# Patient Record
Sex: Female | Born: 1970 | Hispanic: No | Marital: Married | Smoking: Former smoker
Health system: Southern US, Community
[De-identification: ages and names within clinical notes are randomized; demographics above are authoritative.]

## PROBLEM LIST (undated history)

## (undated) DIAGNOSIS — E785 Hyperlipidemia, unspecified: Secondary | ICD-10-CM

## (undated) DIAGNOSIS — J302 Other seasonal allergic rhinitis: Secondary | ICD-10-CM

## (undated) DIAGNOSIS — E7800 Pure hypercholesterolemia, unspecified: Secondary | ICD-10-CM

## (undated) HISTORY — PX: DILATION AND CURETTAGE OF UTERUS: SHX78

## (undated) HISTORY — DX: Other seasonal allergic rhinitis: J30.2

## (undated) HISTORY — DX: Pure hypercholesterolemia, unspecified: E78.00

## (undated) HISTORY — DX: Hyperlipidemia, unspecified: E78.5

---

## 2002-06-20 ENCOUNTER — Other Ambulatory Visit: Admission: RE | Admit: 2002-06-20 | Discharge: 2002-06-20 | Payer: Self-pay | Admitting: Gynecology

## 2003-02-01 ENCOUNTER — Encounter: Admission: RE | Admit: 2003-02-01 | Discharge: 2003-02-01 | Payer: Self-pay | Admitting: Family Medicine

## 2003-02-01 ENCOUNTER — Encounter: Payer: Self-pay | Admitting: Family Medicine

## 2003-02-10 ENCOUNTER — Encounter: Admission: RE | Admit: 2003-02-10 | Discharge: 2003-04-01 | Payer: Self-pay | Admitting: Family Medicine

## 2003-06-25 ENCOUNTER — Other Ambulatory Visit: Admission: RE | Admit: 2003-06-25 | Discharge: 2003-06-25 | Payer: Self-pay | Admitting: Gynecology

## 2004-07-26 ENCOUNTER — Other Ambulatory Visit: Admission: RE | Admit: 2004-07-26 | Discharge: 2004-07-26 | Payer: Self-pay | Admitting: Gynecology

## 2005-08-05 ENCOUNTER — Other Ambulatory Visit: Admission: RE | Admit: 2005-08-05 | Discharge: 2005-08-05 | Payer: Self-pay | Admitting: Gynecology

## 2006-08-08 ENCOUNTER — Other Ambulatory Visit: Admission: RE | Admit: 2006-08-08 | Discharge: 2006-08-08 | Payer: Self-pay | Admitting: Gynecology

## 2007-05-31 HISTORY — PX: TUBAL LIGATION: SHX77

## 2007-08-10 ENCOUNTER — Other Ambulatory Visit: Admission: RE | Admit: 2007-08-10 | Discharge: 2007-08-10 | Payer: Self-pay | Admitting: Gynecology

## 2007-10-05 ENCOUNTER — Ambulatory Visit (HOSPITAL_BASED_OUTPATIENT_CLINIC_OR_DEPARTMENT_OTHER): Admission: RE | Admit: 2007-10-05 | Discharge: 2007-10-05 | Payer: Self-pay | Admitting: Gynecology

## 2008-11-13 ENCOUNTER — Encounter: Payer: Self-pay | Admitting: Gynecology

## 2008-11-13 ENCOUNTER — Other Ambulatory Visit: Admission: RE | Admit: 2008-11-13 | Discharge: 2008-11-13 | Payer: Self-pay | Admitting: Gynecology

## 2008-11-13 ENCOUNTER — Ambulatory Visit: Payer: Self-pay | Admitting: Gynecology

## 2009-11-16 ENCOUNTER — Other Ambulatory Visit: Admission: RE | Admit: 2009-11-16 | Discharge: 2009-11-16 | Payer: Self-pay | Admitting: Gynecology

## 2009-11-16 ENCOUNTER — Ambulatory Visit: Payer: Self-pay | Admitting: Gynecology

## 2009-12-04 ENCOUNTER — Encounter: Admission: RE | Admit: 2009-12-04 | Discharge: 2009-12-04 | Payer: Self-pay | Admitting: Gynecology

## 2010-10-12 NOTE — Op Note (Signed)
[name], Gina Pratt             ACCOUNT NO.:  [id number]   MEDICAL RECORD NO.:  [id number]          PATIENT TYPE:  AMB   LOCATION:  [facility]                         FACILITY:  [facility]   PHYSICIAN:  Timothy P. Fontaine, M.D.DATE OF BIRTH:  16-Aug-1970   DATE OF PROCEDURE:  10/05/2007  DATE OF DISCHARGE:                               OPERATIVE REPORT   PREOPERATIVE DIAGNOSIS:  Desires permanent sterilization.   POSTOPERATIVE DIAGNOSIS:  Desires permanent sterilization.   PROCEDURE:  Laparoscopic bilateral tubal sterilization, Falope ring  technique.   SURGEON:  Timothy P. Fontaine, M.D.   ANESTHETIC:  General.   COMPLICATIONS:  None.   ESTIMATED BLOOD LOSS:  Minimal.   SPECIMEN:  None.   FINDINGS:  EUA:  External, BUS, vagina normal.  Cervix normal uterus  normal size, midline and mobile.  Adnexa without masses.  Laparoscopic:  Anterior cul-de-sac normal.  Posterior cul-de-sac normal.  Uterus normal  size, shape, and contour.  Right and left fallopian tubes normal length,  caliber, fimbriated ends.  Single Falope ring applied to both sides.  Right and left ovaries grossly normal, free and mobile.  No evidence of  pelvic endometriosis or adhesive disease.  Upper abdominal exam is  normal, noting liver smooth.  Gallbladder not visualized.  Proximal  portion of appendix grossly normal, distal portion not visualized.   PROCEDURE:  The patient was taken to the operating room and underwent  general anesthesia.  She was placed in the low dorsal lithotomy  position.  Received an abdominal, perineal, and vaginal preparation with  Betadine solution.  Bladder emptied with in-and-out Foley  catheterization.  EUA performed, and a Hulka tenaculum was placed in the  cervix.  The patient was draped in the usual fashion.  A vertical  infraumbilical incision was made.  Using the 10 mm OptiVu laparoscopic  trocar the abdomen was entered under direct visualization without  difficulty and  subsequently insufflated.  A midline suprapubic incision  was made and the Falope ring-applying trocar was placed within the  abdominal cavity without difficulty under direct visualization after  transillumination for the vessels.  Examination of the pelvic organs,  upper abdominal exam was carried out with findings noted above.  The  right fallopian tube was then identified, traced from its insertion to  its fimbriated end.  Midtubal segment was then drawn up into the Falope  ring applier, and a single Falope ring applied.  A good segment of the  fallopian tube was within the ring, and manipulation ensured secure  placement.  A.  similar procedure was carried out on the other side.  The suprapubic port was then removed.  Adequate hemostasis visualized.  Gas allowed to escape.  The infraumbilical port was then backed out  under direct visualization, showing adequate hemostasis.  No evidence of  hernia formation.  Both skin  incisions were injected using 0.25% Marcaine, and both skin incisions  closed using Dermabond skin adhesive.  The Hulka tenaculum was removed.  The patient was placed in the supine position, awakened without  difficulty, and taken to the recovery room in good condition, having  tolerated her  procedure well.      Timothy P. Fontaine, M.D.  Electronically Signed     TPF/MEDQ  D:  10/05/2007  T:  10/05/2007  Job:  [id number]

## 2010-10-12 NOTE — H&P (Signed)
Gina Pratt, Gina Pratt             ACCOUNT NO.:  [id number]   MEDICAL RECORD NO.:  [id number]          PATIENT TYPE:  AMB   LOCATION:  [facility]                         FACILITY:  [facility]   PHYSICIAN:  Timothy P. Fontaine, M.D.DATE OF BIRTH:  10/31/1970   DATE OF ADMISSION:  10/05/2007  DATE OF DISCHARGE:                              HISTORY & PHYSICAL   The patient is being admitted to [facility] for surgery  on Oct 05, 2007 at 8:45.   CHIEF COMPLAINT:  Sterilization.   HISTORY OF PRESENT ILLNESS:  A 40 year old G3, P2, AB 1 female who  presents for tubal sterilization.  The patient was counseled as to all  reversible and irreversible alternatives, and she wants to proceed with  tubal sterilization.  She had an attempt at an IUD, but discontinued  this due to side effects.   PAST MEDICAL HISTORY:  None.   PAST SURGICAL HISTORY:  None.   ALLERGIES:  No medications.   CURRENT MEDICATIONS:  None.   REVIEW OF SYSTEMS:  Noncontributory.   FAMILY HISTORY:  Noncontributory.   SOCIAL HISTORY:  Noncontributory.   ADMISSION PHYSICAL EXAMINATION:  VITAL SIGNS:  Afebrile.  Vital signs  are stable.  HEENT: Normal.  LUNGS:  Clear.  CARDIAC:  Regular rate.  No rubs, murmurs, or gallops.  ABDOMEN:  Benign.  PELVIC:  External, BUS, vagina normal.  Cervix normal.  Uterus normal  size, midline, mobile, nontender.  Adnexa without masses or tenderness.   ASSESSMENT:  A 40 year old G3, P2, AB 1 female for laparoscopic tubal  sterilization.  I reviewed all available contraceptive options with her.  Her husband refuses vasectomy.  She has trialed an IUD, but discontinued  due to side effects, and she wants to proceed with tubal sterilization.  I reviewed what is involved with the procedure.  General anesthesia,  laparoscopic surgery, trocar placement, insufflation, multiple port  sites, use of Falope rings, and the possibility of cautery were all  reviewed with her.  I reviewed  the permanency of the procedure, but also  the risk of failure, and she clearly understands these issues.  The  risks of infection requiring prolonged antibiotics, incisional  infections requiring opening and draining of incisions, closure by  secondary intention, incisional issues with cosmetics, keloid formation,  as well as hernia formation, were all discussed, understood, and  accepted.  The risks of hemorrhage necessitating transfusion and the  risks of transfusion were reviewed, as well as the risk of inadvertent  injury to internal organs, including bowel, bladder, ureters, vessels,  and nerves, necessitating major exploratory  reparative surgeries, future reparative surgeries, ostomy formation,  bowel resection, bladder repair, all discussed, understood and accepted.  The patient's questions were answered to her satisfaction.  She is ready  to proceed with the surgery.      Timothy P. Fontaine, M.D.  Electronically Signed     TPF/MEDQ  D:  10/02/2007  T:  10/02/2007  Job:  [id number]

## 2010-12-16 ENCOUNTER — Other Ambulatory Visit: Payer: Self-pay | Admitting: Family Medicine

## 2010-12-16 ENCOUNTER — Ambulatory Visit
Admission: RE | Admit: 2010-12-16 | Discharge: 2010-12-16 | Disposition: A | Discharge status: Home / Self Care | Payer: 59 | Source: Ambulatory Visit | Attending: Family Medicine | Admitting: Family Medicine

## 2010-12-16 DIAGNOSIS — R1031 Right lower quadrant pain: Secondary | ICD-10-CM

## 2010-12-16 MED ORDER — IOHEXOL 300 MG/ML  SOLN
100.0000 mL | Freq: Once | INTRAMUSCULAR | Status: AC | PRN
  Administered 2010-12-16: 100 mL via INTRAVENOUS

## 2011-01-14 ENCOUNTER — Encounter: Payer: Self-pay | Admitting: *Deleted

## 2011-01-17 ENCOUNTER — Encounter: Payer: Self-pay | Admitting: Gynecology

## 2011-01-17 ENCOUNTER — Other Ambulatory Visit (HOSPITAL_COMMUNITY)
Admission: RE | Admit: 2011-01-17 | Discharge: 2011-01-17 | Disposition: A | Discharge status: Home / Self Care | Payer: 59 | Source: Ambulatory Visit | Attending: Gynecology | Admitting: Gynecology

## 2011-01-17 ENCOUNTER — Ambulatory Visit (INDEPENDENT_AMBULATORY_CARE_PROVIDER_SITE_OTHER): Payer: 59 | Admitting: Gynecology

## 2011-01-17 VITALS — BP 120/74 | Ht 60.0 in | Wt 145.0 lb

## 2011-01-17 DIAGNOSIS — R823 Hemoglobinuria: Secondary | ICD-10-CM

## 2011-01-17 DIAGNOSIS — Z13220 Encounter for screening for lipoid disorders: Secondary | ICD-10-CM

## 2011-01-17 DIAGNOSIS — Z01419 Encounter for gynecological examination (general) (routine) without abnormal findings: Secondary | ICD-10-CM | POA: Insufficient documentation

## 2011-01-17 DIAGNOSIS — Z131 Encounter for screening for diabetes mellitus: Secondary | ICD-10-CM

## 2011-01-17 NOTE — Progress Notes (Signed)
TALLIE DODDS 06/17/1970 [id number]        40 y.o.  for annual exam.  Had episode of lower pelvic abdominal pain earlier this month saw her primary who ultimately ordered a CT scan which was negative. There is some question of a collapsed luteal cyst. Patient notes that her pain has resolved. She is status post BTL, periods are regular. She did have a mildly abnormal lipid profile last year was to follow up for fasting but never did that. She notes she is exercising now and is fasting today we'll check a lipid profile.  Past medical history,surgical history, allergies, family history and social history were all reviewed and documented in the EPIC chart. ROS:  Was performed and pertinent positives and negatives are included in the history.  Exam: chaperone present Filed Vitals:   01/17/11 0901  BP: 120/74   General appearance  Normal Skin grossly normal Head/Neck normal with no cervical or supraclavicular adenopathy thyroid normal Lungs  clear Cardiac RR, without RMG Abdominal  soft, nontender, without masses, organomegaly or hernia Breasts  examined lying and sitting without masses, retractions, discharge or axillary adenopathy. Pelvic  Ext/BUS/vagina  normal   Cervix  normal  Pap done  Uterus  anteverted, normal size, shape and contour, midline and mobile nontender   Adnexa  Without masses or tenderness    Anus and perineum  normal   Rectovaginal  normal sphincter tone without palpated masses or tenderness.    Assessment/Plan:  40 y.o. female for annual exam.   Overall doing well. Had episode of abdominal pelvic pain previously now resolved with a negative CT. Patient will monitor as long as pain free we'll follow if recurrence she is to present to me for further evaluation. Self breast exams on a monthly basis discussed and urge. She is due for her mammogram now and I reminded her to schedule this. Increased calcium and vitamin D reviewed. We'll check baseline labs of CBC, glucose,  fasting lipid profile and urinalysis.    Dara Lords MD, 9:22 AM 01/17/2011

## 2011-01-18 ENCOUNTER — Telehealth: Payer: Self-pay | Admitting: *Deleted

## 2011-01-18 DIAGNOSIS — E7800 Pure hypercholesterolemia, unspecified: Secondary | ICD-10-CM

## 2011-01-18 NOTE — Telephone Encounter (Signed)
PT INFORMED WITH THE BELOW NOTE, ORDER IN COMPUTER

## 2011-01-18 NOTE — Telephone Encounter (Signed)
Message copied by Aura Camps on Tue Jan 18, 2011  9:20 AM ------      Message from: Dara Lords      Created: Tue Jan 18, 2011  9:06 AM       tell patient of mildly elevated cholesterol and LDL recommend rechecking a fasting lipid profile

## 2011-06-14 ENCOUNTER — Other Ambulatory Visit: Payer: Self-pay | Admitting: Family Medicine

## 2011-06-14 DIAGNOSIS — Z1231 Encounter for screening mammogram for malignant neoplasm of breast: Secondary | ICD-10-CM

## 2011-06-29 ENCOUNTER — Ambulatory Visit
Admission: RE | Admit: 2011-06-29 | Discharge: 2011-06-29 | Disposition: A | Discharge status: Home / Self Care | Payer: 59 | Source: Ambulatory Visit | Attending: Family Medicine | Admitting: Family Medicine

## 2011-06-29 DIAGNOSIS — Z1231 Encounter for screening mammogram for malignant neoplasm of breast: Secondary | ICD-10-CM

## 2012-03-27 ENCOUNTER — Ambulatory Visit (INDEPENDENT_AMBULATORY_CARE_PROVIDER_SITE_OTHER): Payer: 59 | Admitting: Gynecology

## 2012-03-27 ENCOUNTER — Encounter: Payer: Self-pay | Admitting: Gynecology

## 2012-03-27 VITALS — BP 118/74 | Ht 60.0 in | Wt 150.0 lb

## 2012-03-27 DIAGNOSIS — Z13220 Encounter for screening for lipoid disorders: Secondary | ICD-10-CM

## 2012-03-27 DIAGNOSIS — Z01419 Encounter for gynecological examination (general) (routine) without abnormal findings: Secondary | ICD-10-CM

## 2012-03-27 DIAGNOSIS — Z131 Encounter for screening for diabetes mellitus: Secondary | ICD-10-CM

## 2012-03-27 DIAGNOSIS — M2550 Pain in unspecified joint: Secondary | ICD-10-CM

## 2012-03-27 LAB — CBC WITH DIFFERENTIAL/PLATELET
Basophils Absolute: 0 10*3/uL (ref 0.0–0.1)
Eosinophils Relative: 2 % (ref 0–5)
Lymphocytes Relative: 32 % (ref 12–46)
Lymphs Abs: 1.9 10*3/uL (ref 0.7–4.0)
MCV: 88.6 fL (ref 78.0–100.0)
Neutro Abs: 3.6 10*3/uL (ref 1.7–7.7)
Platelets: 233 10*3/uL (ref 150–400)
RBC: 4.82 MIL/uL (ref 3.87–5.11)
RDW: 13.9 % (ref 11.5–15.5)
WBC: 5.8 10*3/uL (ref 4.0–10.5)

## 2012-03-27 NOTE — Progress Notes (Signed)
Gina Pratt [birth year] [id number]        41 y.o.  [id number] for annual exam.    Past medical history,surgical history, medications, allergies, family history and social history were all reviewed and documented in the EPIC chart. ROS:  Was performed and pertinent positives and negatives are included in the history.  Exam: Sherrilyn Rist assistant Filed Vitals:   03/27/12 0935  BP: 118/74  Height: 5' (1.524 m)  Weight: 150 lb (68.04 kg)   General appearance  Normal Skin grossly normal Head/Neck normal with no cervical or supraclavicular adenopathy thyroid normal Lungs  clear Cardiac RR, without RMG Abdominal  soft, nontender, without masses, organomegaly or hernia Breasts  examined lying and sitting without masses, retractions, discharge or axillary adenopathy. Pelvic  Ext/BUS/vagina  normal   Cervix  normal   Uterus  anteverted, normal size, shape and contour, midline and mobile nontender   Adnexa  Without masses or tenderness    Anus and perineum  normal   Rectovaginal  normal sphincter tone without palpated masses or tenderness.    Assessment/Plan:  41 y.o. [id number] female for annual exam, regular menses, BTL contraception.   1. Arthralgias. Patient noted some joint stiffness in her hands comes and goes as well as her knees and hips. No swelling or other symptoms such as skin changes, rashes, hair, weight.  We'll check ANA rheumatoid factor otherwise assuming normal plan expected management. Increased exercise reviewed. 2. Neck pain. Patient has two-week tenderness lower C-spine that she thinks is due to Zumba exercises she was doing. No radiation numbness tingling or other disc symptoms. Recommended heat, nonsteroidal anti-inflammatory. Orthopedic follow up if persists. 3. Mammography. Patient doing January 2014 I reminded her to schedule this. SBE monthly reviewed. 4. Pap smear. No Pap smear done today. Last Pap smear 2012. No history of abnormal Pap smears. Plan every 3-5 year  screening. 5. Health maintenance. Baseline CBC lipid profile glucose urinalysis ordered. She is fasting. Follow up one year, sooner as needed.    Dara Lords MD, 10:15 AM 03/27/2012

## 2012-03-27 NOTE — Patient Instructions (Signed)
Follow up for lab work results. Follow up for annual exam in one year

## 2012-03-28 ENCOUNTER — Other Ambulatory Visit: Payer: Self-pay | Admitting: Gynecology

## 2012-03-28 DIAGNOSIS — E7800 Pure hypercholesterolemia, unspecified: Secondary | ICD-10-CM

## 2012-03-28 LAB — URINALYSIS W MICROSCOPIC + REFLEX CULTURE
Crystals: NONE SEEN
Leukocytes, UA: NEGATIVE
Nitrite: NEGATIVE
Protein, ur: NEGATIVE mg/dL
Specific Gravity, Urine: 1.022 (ref 1.005–1.030)
Squamous Epithelial / LPF: NONE SEEN
Urobilinogen, UA: 0.2 mg/dL (ref 0.0–1.0)

## 2012-03-28 LAB — RHEUMATOID FACTOR: Rhuematoid fact SerPl-aCnc: <10 IU/mL (ref <=14)

## 2012-03-28 LAB — LIPID PANEL
Cholesterol: 235 mg/dL — ABNORMAL HIGH (ref 0–200)
Total CHOL/HDL Ratio: 4.4 Ratio
VLDL: 28 mg/dL (ref 0–40)

## 2012-03-28 LAB — GLUCOSE, RANDOM: Glucose, Bld: 86 mg/dL (ref 70–99)

## 2012-09-11 ENCOUNTER — Other Ambulatory Visit: Payer: Self-pay

## 2013-04-03 ENCOUNTER — Encounter: Payer: 59 | Admitting: Gynecology

## 2013-04-09 IMAGING — CT CT ABD-PELV W/ CM
2 of 5 series · 17 of 46 positions shown, 19 images · IV contrast (READICAT/WATER & [ID] OMNI 300)
Comparison: None

CLINICAL DATA: Right lower quadrant pain.

CT ABDOMEN AND PELVIS WITH CONTRAST
TECHNIQUE: Multidetector CT imaging of the abdomen and pelvis was
performed following the standard protocol during bolus
administration of intravenous contrast.
Contrast: 100 ml Omnipaque 300 IV.

[Series 2: abdomen w/ · axial · 0.75mm/px · z∈[-372,-2]mm · 14 of 84 slices shown, 16 images]
[im 5/84  soft-tissue]
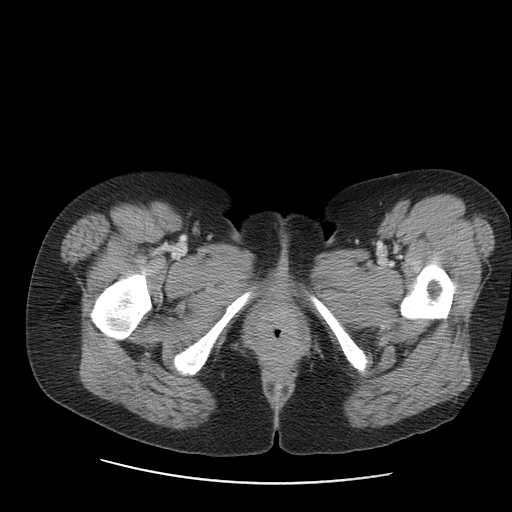
[im 5/84  bone]
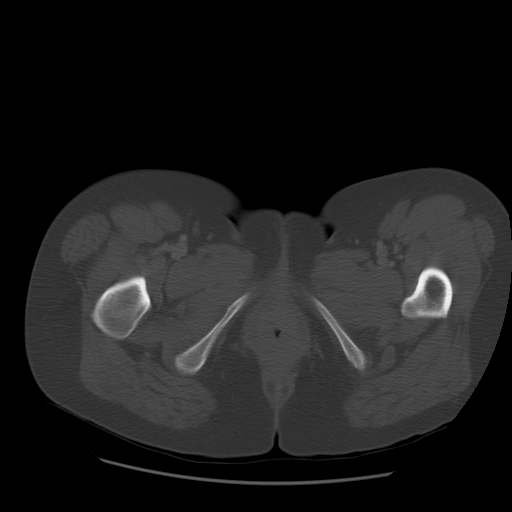
[im 9/84  soft-tissue]
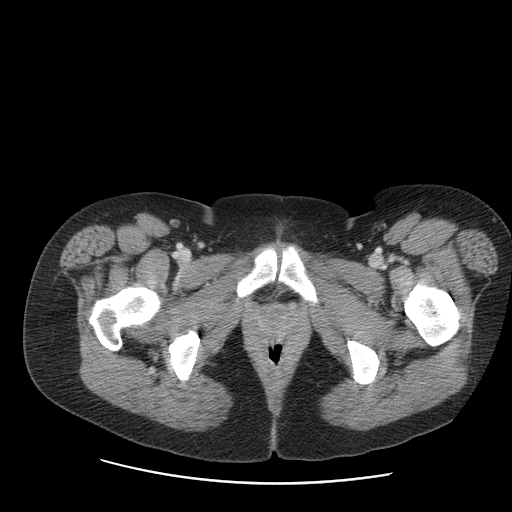
[im 18/84  soft-tissue]
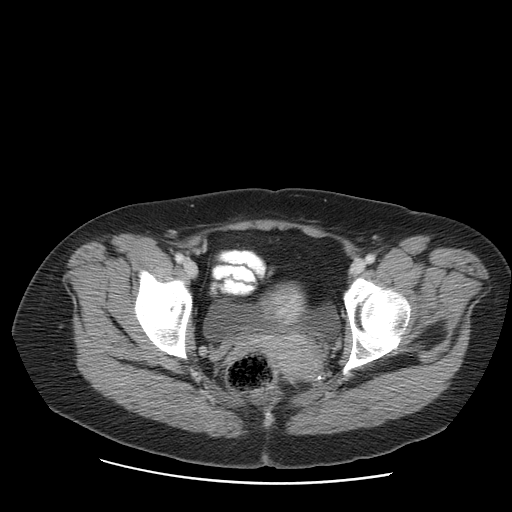
[im 22/84  soft-tissue]
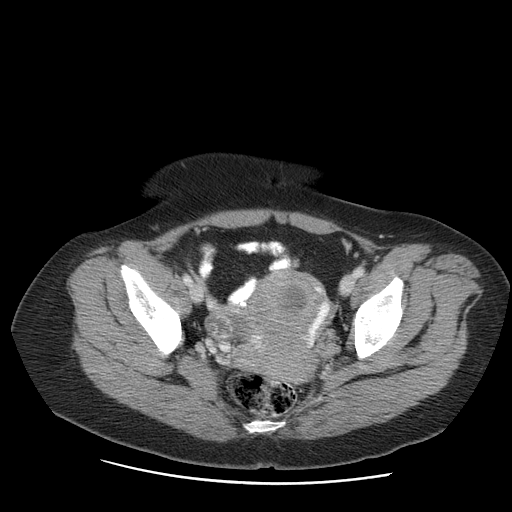
[im 27/84  soft-tissue]
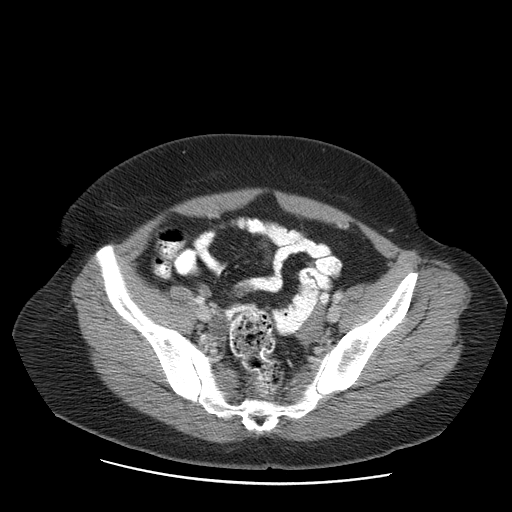
[im 35/84  soft-tissue]
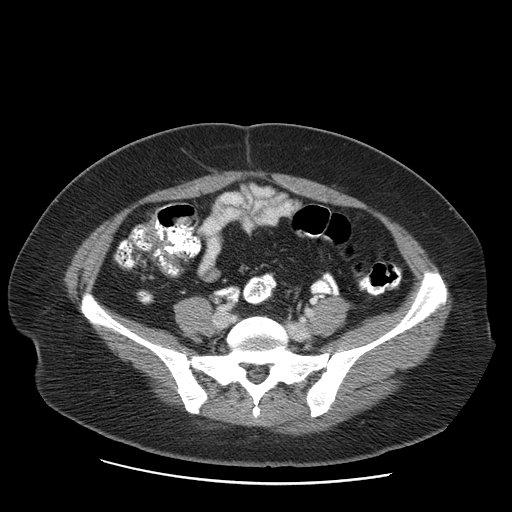
[im 40/84  soft-tissue]
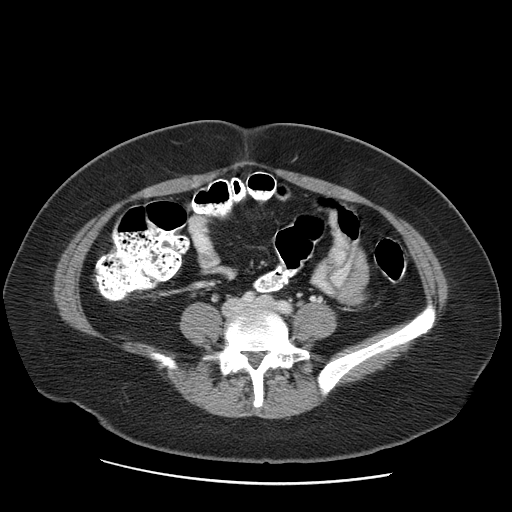
[im 44/84  soft-tissue]
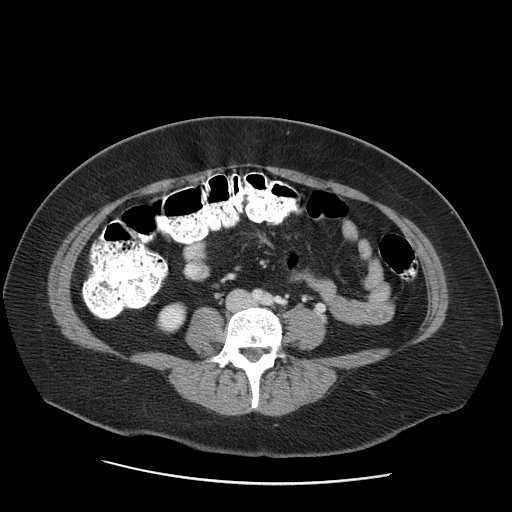
[im 49/84  soft-tissue]
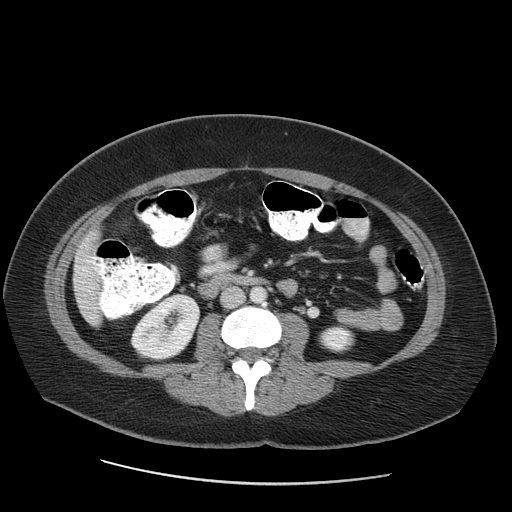
[im 49/84  bone]
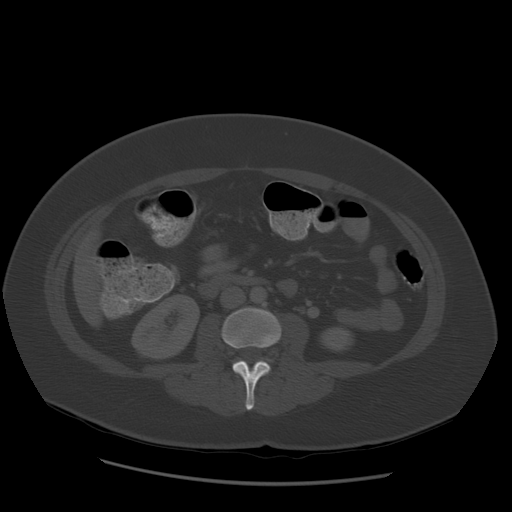
[im 57/84  soft-tissue]
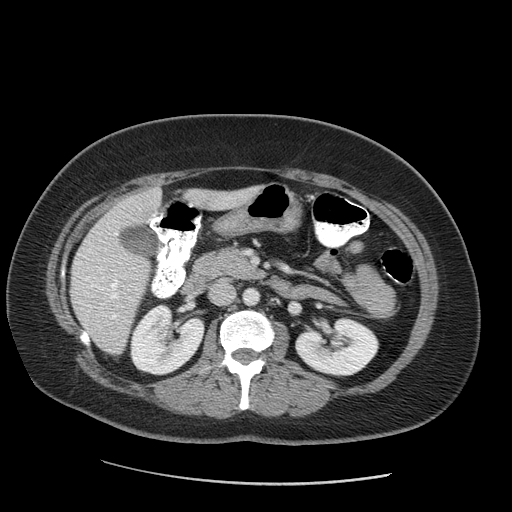
[im 62/84  soft-tissue]
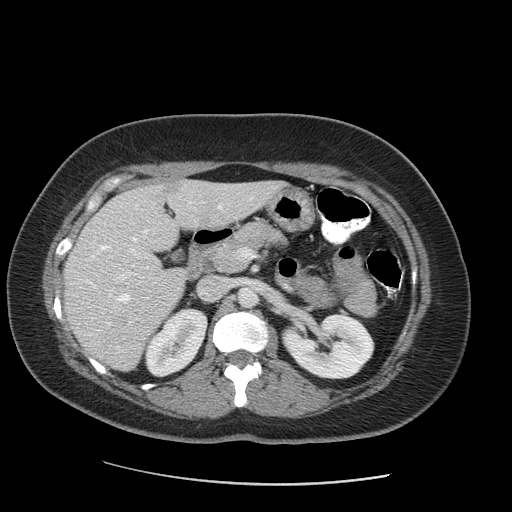
[im 66/84  soft-tissue]
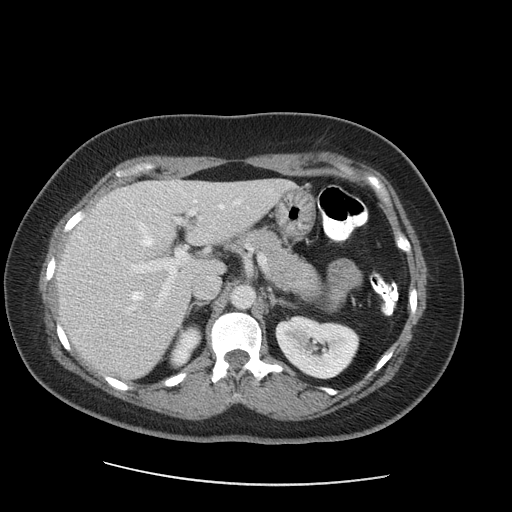
[im 75/84  soft-tissue]
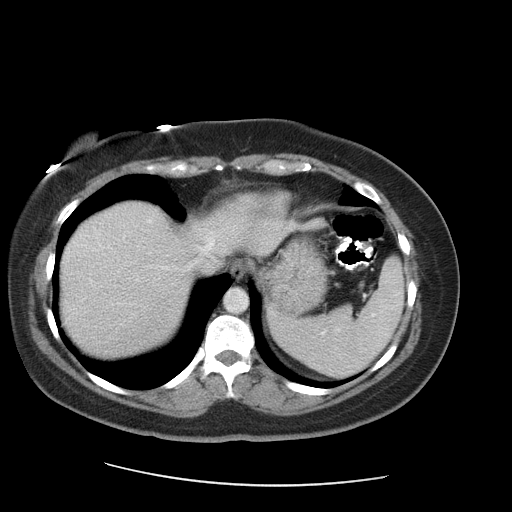
[im 79/84  soft-tissue]
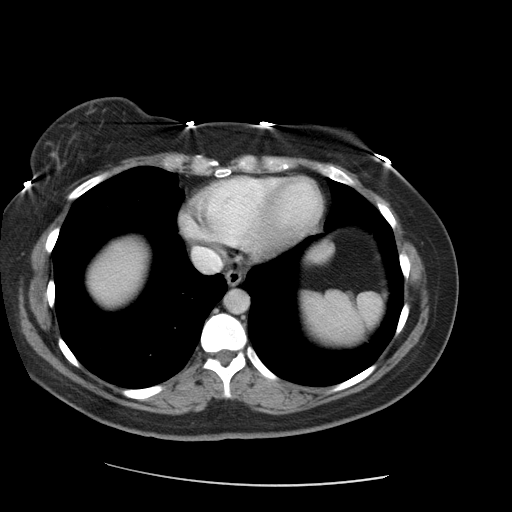

[Series 400: cor · coronal · 0.83mm/px · 3 of 114 slices shown]
[im 38/114  soft-tissue]
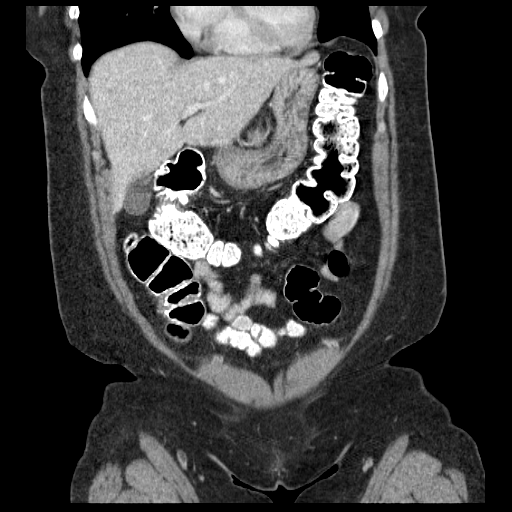
[im 51/114  soft-tissue]
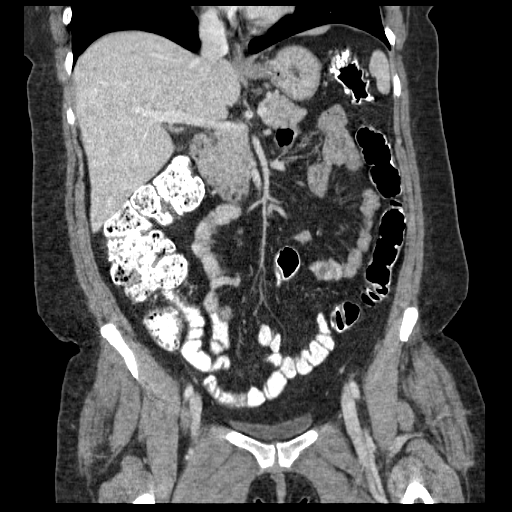
[im 63/114  soft-tissue]
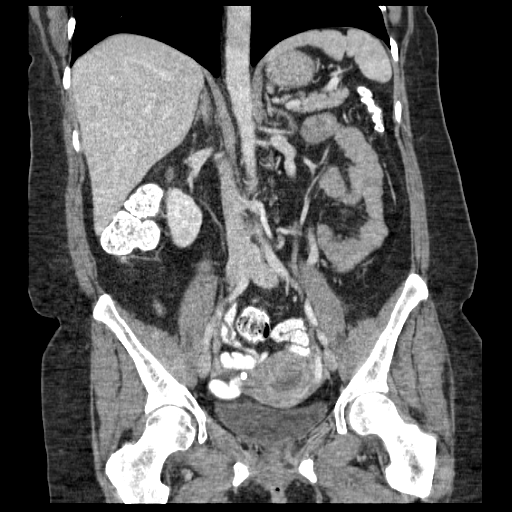

[17 of 46 positions shown; findings below may reference images not displayed]

FINDINGS: Lung bases are clear.  No effusions.  Heart is normal
size.

Liver, gallbladder, spleen, pancreas, adrenals and kidneys are
normal.

Appendix is visualized and is normal.  Large and small bowel are
unremarkable.  No free fluid, free air or adenopathy.  Probable
collapsing corpus luteal cyst in the right ovary.  Uterus and left
ovary unremarkable.  Aorta is normal caliber.

No acute bony abnormality.
IMPRESSION: Normal appendix.

Probable collapsing right ovarian corpus luteal cyst.

No acute findings.

## 2013-05-17 ENCOUNTER — Encounter: Payer: 59 | Admitting: Gynecology

## 2013-08-13 ENCOUNTER — Other Ambulatory Visit (HOSPITAL_COMMUNITY)
Admission: RE | Admit: 2013-08-13 | Discharge: 2013-08-13 | Disposition: A | Discharge status: Home / Self Care | Payer: 59 | Source: Ambulatory Visit | Attending: Gynecology | Admitting: Gynecology

## 2013-08-13 ENCOUNTER — Ambulatory Visit (INDEPENDENT_AMBULATORY_CARE_PROVIDER_SITE_OTHER): Payer: 59 | Admitting: Gynecology

## 2013-08-13 ENCOUNTER — Encounter: Payer: Self-pay | Admitting: Gynecology

## 2013-08-13 VITALS — BP 116/70 | Ht 60.0 in | Wt 154.0 lb

## 2013-08-13 DIAGNOSIS — Z01419 Encounter for gynecological examination (general) (routine) without abnormal findings: Secondary | ICD-10-CM

## 2013-08-13 DIAGNOSIS — R5383 Other fatigue: Secondary | ICD-10-CM

## 2013-08-13 DIAGNOSIS — Z1151 Encounter for screening for human papillomavirus (HPV): Secondary | ICD-10-CM | POA: Insufficient documentation

## 2013-08-13 DIAGNOSIS — R5381 Other malaise: Secondary | ICD-10-CM

## 2013-08-13 NOTE — Addendum Note (Signed)
Addended by: Nelva Nay on: 08/13/2013 09:38 AM   Modules accepted: Orders

## 2013-08-13 NOTE — Progress Notes (Signed)
Gina Pratt Jun 19, 1970 [id number]        43 y.o.  [id number] for annual exam.  Doing well. Several issues noted below.  Past medical history,surgical history, problem list, medications, allergies, family history and social history were all reviewed and documented in the EPIC chart.  ROS:  Performed and pertinent positives and negatives are included in the history, assessment and plan .  Exam: Kim assistant Filed Vitals:   08/13/13 0844  BP: 116/70  Height: 5' (1.524 m)  Weight: 154 lb (69.854 kg)   General appearance  Normal Skin grossly normal Head/Neck normal with no cervical or supraclavicular adenopathy thyroid normal Lungs  clear Cardiac RR, without RMG Abdominal  soft, nontender, without masses, organomegaly or hernia Breasts  examined lying and sitting without masses, retractions, discharge or axillary adenopathy. Pelvic  Ext/BUS/vagina normal  Cervix normal. Pap/HPV  Uterus anteverted, normal size, shape and contour, midline and mobile nontender   Adnexa  Without masses or tenderness    Anus and perineum  Normal   Rectovaginal  Normal sphincter tone without palpated masses or tenderness.    Assessment/Plan:  43 y.o. [id number] female for annual exam with regular menses, tubal sterilization.   1. Fatigue. Patient noted some fatigue. She works has 2 teenagers at home and thinks it's probably situational. Will check CBC comprehensive metabolic panel and TSH. Otherwise assuming normal I think it is situational and she'll monitor. 2. Mammography overdue. I reminded patient to schedule her mammogram and she agrees to do so. 3. Pap smear 2012. Pap/HPV today. No history of abnormal Pap smears previously. Per current screening guidelines we'll repeat in 3-5 years if normal. 4. Health maintenance. Being followed for hypercholesterolemia by Dr. Sabra Heck. We'll recheck baseline labs to include CBC comprehensive metabolic panel lipid profile urinalysis TSH. Followup one year, sooner as  needed.   Note: This document was prepared with digital dictation and possible smart phrase technology. Any transcriptional errors that result from this process are unintentional.   Anastasio Auerbach MD, 9:04 AM 08/13/2013

## 2013-08-13 NOTE — Patient Instructions (Signed)
Followup in one year for annual exam.  You may obtain a copy of any labs that were done today by logging onto MyChart as outlined in the instructions provided with your AVS (after visit summary). The office will not call with normal lab results but certainly if there are any significant abnormalities then we will contact you.   Health Maintenance, Female A healthy lifestyle and preventative care can promote health and wellness.  Maintain regular health, dental, and eye exams.  Eat a healthy diet. Foods like vegetables, fruits, whole grains, low-fat dairy products, and lean protein foods contain the nutrients you need without too many calories. Decrease your intake of foods high in solid fats, added sugars, and salt. Get information about a proper diet from your caregiver, if necessary.  Regular physical exercise is one of the most important things you can do for your health. Most adults should get at least 150 minutes of moderate-intensity exercise (any activity that increases your heart rate and causes you to sweat) each week. In addition, most adults need muscle-strengthening exercises on 2 or more days a week.   Maintain a healthy weight. The body mass index (BMI) is a screening tool to identify possible weight problems. It provides an estimate of body fat based on height and weight. Your caregiver can help determine your BMI, and can help you achieve or maintain a healthy weight. For adults 20 years and older:  A BMI below 18.5 is considered underweight.  A BMI of 18.5 to 24.9 is normal.  A BMI of 25 to 29.9 is considered overweight.  A BMI of 30 and above is considered obese.  Maintain normal blood lipids and cholesterol by exercising and minimizing your intake of saturated fat. Eat a balanced diet with plenty of fruits and vegetables. Blood tests for lipids and cholesterol should begin at age 20 and be repeated every 5 years. If your lipid or cholesterol levels are high, you are over  50, or you are a high risk for heart disease, you may need your cholesterol levels checked more frequently.Ongoing high lipid and cholesterol levels should be treated with medicines if diet and exercise are not effective.  If you smoke, find out from your caregiver how to quit. If you do not use tobacco, do not start.  Lung cancer screening is recommended for adults aged 55 80 years who are at high risk for developing lung cancer because of a history of smoking. Yearly low-dose computed tomography (CT) is recommended for people who have at least a 30-pack-year history of smoking and are a current smoker or have quit within the past 15 years. A pack year of smoking is smoking an average of 1 pack of cigarettes a day for 1 year (for example: 1 pack a day for 30 years or 2 packs a day for 15 years). Yearly screening should continue until the smoker has stopped smoking for at least 15 years. Yearly screening should also be stopped for people who develop a health problem that would prevent them from having lung cancer treatment.  If you are pregnant, do not drink alcohol. If you are breastfeeding, be very cautious about drinking alcohol. If you are not pregnant and choose to drink alcohol, do not exceed 1 drink per day. One drink is considered to be 12 ounces (355 mL) of beer, 5 ounces (148 mL) of wine, or 1.5 ounces (44 mL) of liquor.  Avoid use of street drugs. Do not share needles with anyone. Ask for help   if you need support or instructions about stopping the use of drugs.  High blood pressure causes heart disease and increases the risk of stroke. Blood pressure should be checked at least every 1 to 2 years. Ongoing high blood pressure should be treated with medicines, if weight loss and exercise are not effective.  If you are 55 to 45 years old, ask your caregiver if you should take aspirin to prevent strokes.  Diabetes screening involves taking a blood sample to check your fasting blood sugar level.  This should be done once every 3 years, after age 45, if you are within normal weight and without risk factors for diabetes. Testing should be considered at a younger age or be carried out more frequently if you are overweight and have at least 1 risk factor for diabetes.  Breast cancer screening is essential preventative care for women. You should practice "breast self-awareness." This means understanding the normal appearance and feel of your breasts and may include breast self-examination. Any changes detected, no matter how small, should be reported to a caregiver. Women in their 20s and 30s should have a clinical breast exam (CBE) by a caregiver as part of a regular health exam every 1 to 3 years. After age 40, women should have a CBE every year. Starting at age 40, women should consider having a mammogram (breast X-ray) every year. Women who have a family history of breast cancer should talk to their caregiver about genetic screening. Women at a high risk of breast cancer should talk to their caregiver about having an MRI and a mammogram every year.  Breast cancer gene (BRCA)-related cancer risk assessment is recommended for women who have family members with BRCA-related cancers. BRCA-related cancers include breast, ovarian, tubal, and peritoneal cancers. Having family members with these cancers may be associated with an increased risk for harmful changes (mutations) in the breast cancer genes BRCA1 and BRCA2. Results of the assessment will determine the need for genetic counseling and BRCA1 and BRCA2 testing.  The Pap test is a screening test for cervical cancer. Women should have a Pap test starting at age 21. Between ages 21 and 29, Pap tests should be repeated every 2 years. Beginning at age 30, you should have a Pap test every 3 years as long as the past 3 Pap tests have been normal. If you had a hysterectomy for a problem that was not cancer or a condition that could lead to cancer, then you no  longer need Pap tests. If you are between ages 65 and 70, and you have had normal Pap tests going back 10 years, you no longer need Pap tests. If you have had past treatment for cervical cancer or a condition that could lead to cancer, you need Pap tests and screening for cancer for at least 20 years after your treatment. If Pap tests have been discontinued, risk factors (such as a new sexual partner) need to be reassessed to determine if screening should be resumed. Some women have medical problems that increase the chance of getting cervical cancer. In these cases, your caregiver may recommend more frequent screening and Pap tests.  The human papillomavirus (HPV) test is an additional test that may be used for cervical cancer screening. The HPV test looks for the virus that can cause the cell changes on the cervix. The cells collected during the Pap test can be tested for HPV. The HPV test could be used to screen women aged 30 years and older, and   should be used in women of any age who have unclear Pap test results. After the age of 30, women should have HPV testing at the same frequency as a Pap test.  Colorectal cancer can be detected and often prevented. Most routine colorectal cancer screening begins at the age of 50 and continues through age 75. However, your caregiver may recommend screening at an earlier age if you have risk factors for colon cancer. On a yearly basis, your caregiver may provide home test kits to check for hidden blood in the stool. Use of a small camera at the end of a tube, to directly examine the colon (sigmoidoscopy or colonoscopy), can detect the earliest forms of colorectal cancer. Talk to your caregiver about this at age 50, when routine screening begins. Direct examination of the colon should be repeated every 5 to 10 years through age 75, unless early forms of pre-cancerous polyps or small growths are found.  Hepatitis C blood testing is recommended for all people born from  1945 through 1965 and any individual with known risks for hepatitis C.  Practice safe sex. Use condoms and avoid high-risk sexual practices to reduce the spread of sexually transmitted infections (STIs). Sexually active women aged 25 and younger should be checked for Chlamydia, which is a common sexually transmitted infection. Older women with new or multiple partners should also be tested for Chlamydia. Testing for other STIs is recommended if you are sexually active and at increased risk.  Osteoporosis is a disease in which the bones lose minerals and strength with aging. This can result in serious bone fractures. The risk of osteoporosis can be identified using a bone density scan. Women ages 65 and over and women at risk for fractures or osteoporosis should discuss screening with their caregivers. Ask your caregiver whether you should be taking a calcium supplement or vitamin D to reduce the rate of osteoporosis.  Menopause can be associated with physical symptoms and risks. Hormone replacement therapy is available to decrease symptoms and risks. You should talk to your caregiver about whether hormone replacement therapy is right for you.  Use sunscreen. Apply sunscreen liberally and repeatedly throughout the day. You should seek shade when your shadow is shorter than you. Protect yourself by wearing long sleeves, pants, a wide-brimmed hat, and sunglasses year round, whenever you are outdoors.  Notify your caregiver of new moles or changes in moles, especially if there is a change in shape or color. Also notify your caregiver if a mole is larger than the size of a pencil eraser.  Stay current with your immunizations. Document Released: 11/29/2010 Document Revised: 09/10/2012 Document Reviewed: 11/29/2010 [employer] Patient Information 2014 [employer].   

## 2013-08-14 LAB — URINALYSIS W MICROSCOPIC + REFLEX CULTURE
BACTERIA UA: NONE SEEN
BILIRUBIN URINE: NEGATIVE
Casts: NONE SEEN
Crystals: NONE SEEN
Glucose, UA: NEGATIVE mg/dL
Hgb urine dipstick: NEGATIVE
KETONES UR: NEGATIVE mg/dL
Leukocytes, UA: NEGATIVE
Nitrite: NEGATIVE
PROTEIN: NEGATIVE mg/dL
Specific Gravity, Urine: 1.02 (ref 1.005–1.030)
Squamous Epithelial / LPF: NONE SEEN
UROBILINOGEN UA: 0.2 mg/dL (ref 0.0–1.0)
pH: 5.5 (ref 5.0–8.0)

## 2013-10-01 ENCOUNTER — Other Ambulatory Visit: Payer: Self-pay | Admitting: Family Medicine

## 2013-10-01 ENCOUNTER — Ambulatory Visit
Admission: RE | Admit: 2013-10-01 | Discharge: 2013-10-01 | Disposition: A | Discharge status: Home / Self Care | Payer: 59 | Source: Ambulatory Visit | Attending: Family Medicine | Admitting: Family Medicine

## 2013-10-01 DIAGNOSIS — M542 Cervicalgia: Secondary | ICD-10-CM

## 2013-10-31 ENCOUNTER — Other Ambulatory Visit: Payer: Self-pay

## 2013-10-31 DIAGNOSIS — Z1231 Encounter for screening mammogram for malignant neoplasm of breast: Secondary | ICD-10-CM

## 2013-11-08 ENCOUNTER — Ambulatory Visit
Admission: RE | Admit: 2013-11-08 | Discharge: 2013-11-08 | Disposition: A | Discharge status: Home / Self Care | Payer: 59 | Source: Ambulatory Visit

## 2013-11-08 DIAGNOSIS — Z1231 Encounter for screening mammogram for malignant neoplasm of breast: Secondary | ICD-10-CM

## 2013-11-22 ENCOUNTER — Ambulatory Visit: Payer: 59 | Admitting: Gynecology

## 2013-12-04 ENCOUNTER — Encounter: Payer: Self-pay | Admitting: Gynecology

## 2013-12-04 ENCOUNTER — Ambulatory Visit (INDEPENDENT_AMBULATORY_CARE_PROVIDER_SITE_OTHER): Payer: 59 | Admitting: Gynecology

## 2013-12-04 DIAGNOSIS — N898 Other specified noninflammatory disorders of vagina: Secondary | ICD-10-CM

## 2013-12-04 DIAGNOSIS — R350 Frequency of micturition: Secondary | ICD-10-CM

## 2013-12-04 DIAGNOSIS — L293 Anogenital pruritus, unspecified: Secondary | ICD-10-CM

## 2013-12-04 LAB — WET PREP FOR TRICH, YEAST, CLUE
Clue Cells Wet Prep HPF POC: NONE SEEN
Trich, Wet Prep: NONE SEEN

## 2013-12-04 LAB — URINALYSIS W MICROSCOPIC + REFLEX CULTURE
BILIRUBIN URINE: NEGATIVE
GLUCOSE, UA: NEGATIVE mg/dL
Hgb urine dipstick: NEGATIVE
Ketones, ur: NEGATIVE mg/dL
Leukocytes, UA: NEGATIVE
Nitrite: NEGATIVE
Protein, ur: NEGATIVE mg/dL
SPECIFIC GRAVITY, URINE: 1.01 (ref 1.005–1.030)
Urobilinogen, UA: 0.2 mg/dL (ref 0.0–1.0)
pH: 6 (ref 5.0–8.0)

## 2013-12-04 MED ORDER — FLUCONAZOLE 150 MG PO TABS: 150.0000 mg | ORAL_TABLET | Freq: Every day | ORAL | Status: DC

## 2013-12-04 NOTE — Progress Notes (Signed)
Gina Pratt May 25, 1971 [id number]        43 y.o.  [id number] presents with one-week history of vaginal discharge and itching. Also urinary frequency with some suprapubic discomfort. No urgency or dysuria or low back pain. No vaginal odor. No fever chills.  Past medical history,surgical history, problem list, medications, allergies, family history and social history were all reviewed and documented in the EPIC chart.  Directed ROS with pertinent positives and negatives documented in the history of present illness/assessment and plan.  Exam: Kim assistant General appearance:  Normal Spine: Straight without deformity or CVA tenderness. Abdomen: Soft nontender without masses guarding rebound Pelvic: External BUS vagina with white discharge. Cervix normal. Uterus normal size midline mobile nontender. Adnexa without masses or tenderness.  Assessment/Plan:  43 y.o. [id number] with history and wet prep consistent with yeast vulvovaginitis. Her urinalysis is negative. Will treat with Diflucan 150 mg x3 days. Followup if symptoms persist, worsen or recur.   Note: This document was prepared with digital dictation and possible smart phrase technology. Any transcriptional errors that result from this process are unintentional.   Anastasio Auerbach MD, 12:32 PM 12/04/2013

## 2013-12-04 NOTE — Patient Instructions (Signed)
Take Diflucan pill daily for 3 days. Followup if symptoms persist, worsen or recur.

## 2014-03-31 ENCOUNTER — Encounter: Payer: Self-pay | Admitting: Gynecology

## 2014-09-12 ENCOUNTER — Encounter: Payer: Self-pay | Admitting: Gynecology

## 2014-09-30 ENCOUNTER — Ambulatory Visit: Payer: 59

## 2014-10-13 ENCOUNTER — Ambulatory Visit: Payer: 59 | Admitting: Physical Therapy

## 2014-10-29 ENCOUNTER — Ambulatory Visit: Payer: 59 | Attending: Family Medicine | Admitting: Physical Therapy

## 2014-11-06 ENCOUNTER — Encounter: Payer: Self-pay | Admitting: Gynecology

## 2014-11-24 ENCOUNTER — Other Ambulatory Visit: Payer: Self-pay

## 2014-11-24 DIAGNOSIS — Z1231 Encounter for screening mammogram for malignant neoplasm of breast: Secondary | ICD-10-CM

## 2014-12-02 ENCOUNTER — Ambulatory Visit: Payer: 59

## 2014-12-05 ENCOUNTER — Ambulatory Visit
Admission: RE | Admit: 2014-12-05 | Discharge: 2014-12-05 | Disposition: A | Discharge status: Home / Self Care | Payer: 59 | Source: Ambulatory Visit

## 2014-12-05 DIAGNOSIS — Z1231 Encounter for screening mammogram for malignant neoplasm of breast: Secondary | ICD-10-CM

## 2014-12-09 ENCOUNTER — Other Ambulatory Visit: Payer: Self-pay | Admitting: Gynecology

## 2014-12-09 DIAGNOSIS — R928 Other abnormal and inconclusive findings on diagnostic imaging of breast: Secondary | ICD-10-CM

## 2014-12-10 ENCOUNTER — Ambulatory Visit
Admission: RE | Admit: 2014-12-10 | Discharge: 2014-12-10 | Disposition: A | Discharge status: Home / Self Care | Payer: 59 | Source: Ambulatory Visit | Attending: Gynecology | Admitting: Gynecology

## 2014-12-10 ENCOUNTER — Encounter: Payer: Self-pay | Admitting: Gynecology

## 2014-12-10 DIAGNOSIS — R928 Other abnormal and inconclusive findings on diagnostic imaging of breast: Secondary | ICD-10-CM

## 2015-02-06 ENCOUNTER — Encounter: Payer: Self-pay | Admitting: Gynecology

## 2015-02-06 ENCOUNTER — Ambulatory Visit (INDEPENDENT_AMBULATORY_CARE_PROVIDER_SITE_OTHER): Payer: 59 | Admitting: Gynecology

## 2015-02-06 VITALS — BP 118/78 | Ht 60.0 in | Wt 157.0 lb

## 2015-02-06 DIAGNOSIS — R5383 Other fatigue: Secondary | ICD-10-CM

## 2015-02-06 DIAGNOSIS — Z01419 Encounter for gynecological examination (general) (routine) without abnormal findings: Secondary | ICD-10-CM

## 2015-02-06 DIAGNOSIS — N951 Menopausal and female climacteric states: Secondary | ICD-10-CM

## 2015-02-06 LAB — CBC WITH DIFFERENTIAL/PLATELET
Basophils Absolute: 0 K/uL (ref 0.0–0.1)
Basophils Relative: 0 % (ref 0–1)
Eosinophils Absolute: 0.1 K/uL (ref 0.0–0.7)
Eosinophils Relative: 2 % (ref 0–5)
HCT: 40.7 % (ref 36.0–46.0)
Hemoglobin: 13.5 g/dL (ref 12.0–15.0)
Lymphocytes Relative: 27 % (ref 12–46)
Lymphs Abs: 1.8 K/uL (ref 0.7–4.0)
MCH: 28.2 pg (ref 26.0–34.0)
MCHC: 33.2 g/dL (ref 30.0–36.0)
MCV: 85.1 fL (ref 78.0–100.0)
MPV: 11.5 fL (ref 8.6–12.4)
Monocytes Absolute: 0.3 K/uL (ref 0.1–1.0)
Monocytes Relative: 4 % (ref 3–12)
Neutro Abs: 4.6 K/uL (ref 1.7–7.7)
Neutrophils Relative %: 67 % (ref 43–77)
Platelets: 223 K/uL (ref 150–400)
RBC: 4.78 MIL/uL (ref 3.87–5.11)
RDW: 14.3 % (ref 11.5–15.5)
WBC: 6.8 K/uL (ref 4.0–10.5)

## 2015-02-06 LAB — LIPID PANEL
Cholesterol: 213 mg/dL — ABNORMAL HIGH (ref 125–200)
HDL: 49 mg/dL (ref >=46)
LDL Cholesterol: 138 mg/dL — ABNORMAL HIGH (ref <130)
TRIGLYCERIDES: 131 mg/dL (ref <150)
Total CHOL/HDL Ratio: 4.3 Ratio (ref <=5.0)
VLDL: 26 mg/dL (ref <30)

## 2015-02-06 LAB — COMPREHENSIVE METABOLIC PANEL
ALK PHOS: 53 U/L (ref 33–115)
ALT: 15 U/L (ref 6–29)
AST: 17 U/L (ref 10–35)
Albumin: 4.1 g/dL (ref 3.6–5.1)
BUN: 11 mg/dL (ref 7–25)
CALCIUM: 8.9 mg/dL (ref 8.6–10.2)
CO2: 24 mmol/L (ref 20–31)
CREATININE: 0.46 mg/dL — AB (ref 0.50–1.10)
Chloride: 102 mmol/L (ref 98–110)
GLUCOSE: 87 mg/dL (ref 65–99)
Potassium: 4.2 mmol/L (ref 3.5–5.3)
Sodium: 136 mmol/L (ref 135–146)
Total Bilirubin: 0.8 mg/dL (ref 0.2–1.2)
Total Protein: 7.4 g/dL (ref 6.1–8.1)

## 2015-02-06 LAB — TSH: TSH: 1.892 u[IU]/mL (ref 0.350–4.500)

## 2015-02-06 NOTE — Patient Instructions (Signed)

## 2015-02-06 NOTE — Progress Notes (Signed)
Gina Pratt Oct 11, 1970 [id number]        44 y.o.  [id number] for annual exam.  Several issues noted below.  Past medical history,surgical history, problem list, medications, allergies, family history and social history were all reviewed and documented as reviewed in the EPIC chart.  ROS:  Performed with pertinent positives and negatives included in the history, assessment and plan.   Additional significant findings :  none   Exam: Gina Pratt Vitals:   02/06/15 0839  BP: 118/78  Height: 5' (1.524 m)  Weight: 157 lb (71.215 kg)   General appearance:  Normal affect, orientation and appearance. Skin: Grossly normal HEENT: Without gross lesions.  No cervical or supraclavicular adenopathy. Thyroid normal.  Lungs:  Clear without wheezing, rales or rhonchi Cardiac: RR, without RMG Abdominal:  Soft, nontender, without masses, guarding, rebound, organomegaly or hernia Breasts:  Examined lying and sitting without masses, retractions, discharge or axillary adenopathy. Pelvic:  Ext/BUS/vagina normal  Cervix normal  Uterus anteverted, normal size, shape and contour, midline and mobile nontender   Adnexa  Without masses or tenderness    Anus and perineum  Normal   Rectovaginal  Normal sphincter tone without palpated masses or tenderness.    Assessment/Plan:  44 y.o. [id number] female for annual exam with regular menses, sterilization.   1. Menopausal symptoms. Patient does note some hot flashes on and off. Still with regular menses. Also notes a little bit of vaginal dryness with intercourse. Will check baseline FSH. Recommend OTC lubricants with intercourse.  Keep menstrual calendar and report any irregularity. 2. Fatigue. Patient notes some fatigue. No hair skin weight changes. Had similar complaints last year. Was to check baseline labs but apparently left without doing so. Will check TSH CBC, comprehensive metabolic panel today. 3. Mammography 11/2014. Had recall for left breast  which was cleared.  Follow up mammogram in one year. SBE monthly reviewed. 4. Pap smear/HPV negative 2015. No Pap smear done today. No history of significant abnormal Pap smears. Repeat at 3-5 year interval. 5. Health maintenance. Baseline CBC comprehensive metabolic panel lipid profile urinalysis TSH FSH ordered. Follow up in one year, sooner as needed.   Anastasio Auerbach MD, 8:55 AM 02/06/2015

## 2015-02-07 LAB — URINALYSIS W MICROSCOPIC + REFLEX CULTURE
BACTERIA UA: NONE SEEN [HPF]
Bilirubin Urine: NEGATIVE
Casts: NONE SEEN [LPF]
Crystals: NONE SEEN [HPF]
GLUCOSE, UA: NEGATIVE
HGB URINE DIPSTICK: NEGATIVE
KETONES UR: NEGATIVE
Leukocytes, UA: NEGATIVE
NITRITE: NEGATIVE
PH: 5.5 (ref 5.0–8.0)
PROTEIN: NEGATIVE
SQUAMOUS EPITHELIAL / LPF: NONE SEEN [HPF] (ref <=5)
Specific Gravity, Urine: 1.021 (ref 1.001–1.035)
WBC, UA: NONE SEEN WBC/HPF (ref <=5)
YEAST: NONE SEEN [HPF]

## 2015-02-07 LAB — FOLLICLE STIMULATING HORMONE: FSH: 19 m[IU]/mL

## 2015-02-08 LAB — URINE CULTURE
COLONY COUNT: NO GROWTH
Organism ID, Bacteria: NO GROWTH

## 2016-02-08 ENCOUNTER — Ambulatory Visit (INDEPENDENT_AMBULATORY_CARE_PROVIDER_SITE_OTHER): Payer: 59 | Admitting: Gynecology

## 2016-02-08 ENCOUNTER — Encounter: Payer: Self-pay | Admitting: Gynecology

## 2016-02-08 VITALS — BP 118/76 | Ht 60.0 in | Wt 155.0 lb

## 2016-02-08 DIAGNOSIS — N951 Menopausal and female climacteric states: Secondary | ICD-10-CM

## 2016-02-08 DIAGNOSIS — Z01419 Encounter for gynecological examination (general) (routine) without abnormal findings: Secondary | ICD-10-CM | POA: Diagnosis not present

## 2016-02-08 DIAGNOSIS — Z13220 Encounter for screening for lipoid disorders: Secondary | ICD-10-CM

## 2016-02-08 LAB — LIPID PANEL
Cholesterol: 204 mg/dL — ABNORMAL HIGH (ref 125–200)
HDL: 40 mg/dL — AB (ref >=46)
LDL CALC: 143 mg/dL — AB (ref <130)
TRIGLYCERIDES: 103 mg/dL (ref <150)
Total CHOL/HDL Ratio: 5.1 Ratio — ABNORMAL HIGH (ref <=5.0)
VLDL: 21 mg/dL (ref <30)

## 2016-02-08 LAB — CBC WITH DIFFERENTIAL/PLATELET
BASOS ABS: 0 {cells}/uL (ref 0–200)
Basophils Relative: 0 %
EOS PCT: 3 %
Eosinophils Absolute: 171 cells/uL (ref 15–500)
HEMATOCRIT: 38.5 % (ref 35.0–45.0)
HEMOGLOBIN: 12.8 g/dL (ref 11.7–15.5)
LYMPHS PCT: 34 %
Lymphs Abs: 1938 cells/uL (ref 850–3900)
MCH: 28.4 pg (ref 27.0–33.0)
MCHC: 33.2 g/dL (ref 32.0–36.0)
MCV: 85.4 fL (ref 80.0–100.0)
MPV: 12 fL (ref 7.5–12.5)
Monocytes Absolute: 285 cells/uL (ref 200–950)
Monocytes Relative: 5 %
Neutro Abs: 3306 cells/uL (ref 1500–7800)
Neutrophils Relative %: 58 %
Platelets: 199 10*3/uL (ref 140–400)
RBC: 4.51 MIL/uL (ref 3.80–5.10)
RDW: 14.4 % (ref 11.0–15.0)
WBC: 5.7 10*3/uL (ref 3.8–10.8)

## 2016-02-08 LAB — COMPREHENSIVE METABOLIC PANEL
ALBUMIN: 4.1 g/dL (ref 3.6–5.1)
ALT: 13 U/L (ref 6–29)
AST: 14 U/L (ref 10–35)
Alkaline Phosphatase: 48 U/L (ref 33–115)
BILIRUBIN TOTAL: 0.9 mg/dL (ref 0.2–1.2)
BUN: 10 mg/dL (ref 7–25)
CALCIUM: 8.8 mg/dL (ref 8.6–10.2)
CHLORIDE: 107 mmol/L (ref 98–110)
CO2: 23 mmol/L (ref 20–31)
Creat: 0.53 mg/dL (ref 0.50–1.10)
Glucose, Bld: 89 mg/dL (ref 65–99)
Potassium: 3.7 mmol/L (ref 3.5–5.3)
SODIUM: 137 mmol/L (ref 135–146)
Total Protein: 6.9 g/dL (ref 6.1–8.1)

## 2016-02-08 NOTE — Progress Notes (Signed)
    DIAMONDNIQUE TIPPEN May 21, 1971 [id number]        45 y.o.  [id number]  for annual exam.  Several issues noted below.  Past medical history,surgical history, problem list, medications, allergies, family history and social history were all reviewed and documented as reviewed in the EPIC chart.  ROS:  Performed with pertinent positives and negatives included in the history, assessment and plan.   Additional significant findings :  None   Exam: Caryn Bee assistant Vitals:   02/08/16 0900  BP: 118/76  Weight: 155 lb (70.3 kg)  Height: 5' (1.524 m)   Body mass index is 30.27 kg/m.  General appearance:  Normal affect, orientation and appearance. Skin: Grossly normal HEENT: Without gross lesions.  No cervical or supraclavicular adenopathy. Thyroid normal.  Lungs:  Clear without wheezing, rales or rhonchi Cardiac: RR, without RMG Abdominal:  Soft, nontender, without masses, guarding, rebound, organomegaly or hernia Breasts:  Examined lying and sitting without masses, retractions, discharge or axillary adenopathy. Pelvic:  Ext/BUS/Vagina normal  Cervix normal  Uterus anteverted, normal size, shape and contour, midline and mobile nontender   Adnexa without masses or tenderness    Anus and perineum normal   Rectovaginal normal sphincter tone without palpated masses or tenderness.    Assessment/Plan:  45 y.o. [id number] female for annual exam regular menses, tubal sterilization.   1. Menopausal symptoms.  Patient reports hot flashes and some night sweats. Also having some vaginal dryness. Continues to have regular menses. We'll check baseline labs to include thyroid panel, FSH and ANA as general screen for connective tissue issues. Reviewed the perimenopausal time frame with her and symptomatology. Recommended for now to try soy-based products such as Estrovin to see if this does not help. Use of OTC vaginal lubricants for intercourse. Possible low-dose estrogen supplementation even if [place]  is normal to see if this does not help discussed. Risks of estrogen supplementation to include thrombosis impossible breast cancer issues reviewed. At this point we'll try the soy-based product first, follow up on the labs and then she will call me if she is interested in trying a low-dose estrogen supplementation for her symptoms. Routes of administration to include transdermal and oral reviewed. Benefits of each discussed. 2. Mammography due now. Reminded patient to call and schedule and she agrees to do so. SBE monthly reviewed. 3. Pap smear/HPV 07/2013 negative. No Pap smear done today. No history of significant abnormal Pap smears. Plan repeat Pap smear approaching 5 year interval per current screening guidelines. 4. Health maintenance. Baseline CBC, CMP, lipid profile, thyroid panel, FSH, ANA, urinalysis ordered. Follow up for results. Follow up for response to soy-based products. Follow up in one year for annual exam  Additional time time in excess of her routine gynecologic exam was spent in direct face to face counseling and coordination of care in regards to her problems of menopausal symptoms and treatment options.     Anastasio Auerbach MD, 9:31 AM 02/08/2016

## 2016-02-08 NOTE — Addendum Note (Signed)
Addended by: Anastasio Auerbach on: 02/08/2016 10:02 AM   Modules accepted: Orders

## 2016-02-08 NOTE — Patient Instructions (Signed)
Try some of the over the counter soy based products to see if it does not help with your hot flushes 1 brand-name is Cherene Julian  You may obtain a copy of any labs that were done today by logging onto MyChart as outlined in the instructions provided with your AVS (after visit summary). The office will not call with normal lab results but certainly if there are any significant abnormalities then we will contact you.   Health Maintenance Adopting a healthy lifestyle and getting preventive care can go a long way to promote health and wellness. Talk with your health care provider about what schedule of regular examinations is right for you. This is a good chance for you to check in with your provider about disease prevention and staying healthy. In between checkups, there are plenty of things you can do on your own. Experts have done a lot of research about which lifestyle changes and preventive measures are most likely to keep you healthy. Ask your health care provider for more information. WEIGHT AND DIET  Eat a healthy diet  Be sure to include plenty of vegetables, fruits, low-fat dairy products, and lean protein.  Do not eat a lot of foods high in solid fats, added sugars, or salt.  Get regular exercise. This is one of the most important things you can do for your health.  Most adults should exercise for at least 150 minutes each week. The exercise should increase your heart rate and make you sweat (moderate-intensity exercise).  Most adults should also do strengthening exercises at least twice a week. This is in addition to the moderate-intensity exercise.  Maintain a healthy weight  Body mass index (BMI) is a measurement that can be used to identify possible weight problems. It estimates body fat based on height and weight. Your health care provider can help determine your BMI and help you achieve or maintain a healthy weight.  For females 65 years of age and older:   A BMI below 18.5 is  considered underweight.  A BMI of 18.5 to 24.9 is normal.  A BMI of 25 to 29.9 is considered overweight.  A BMI of 30 and above is considered obese.  Watch levels of cholesterol and blood lipids  You should start having your blood tested for lipids and cholesterol at 47 years of age, then have this test every 5 years.  You may need to have your cholesterol levels checked more often if:  Your lipid or cholesterol levels are high.  You are older than 47 years of age.  You are at high risk for heart disease.  CANCER SCREENING   Lung Cancer  Lung cancer screening is recommended for adults 20-41 years old who are at high risk for lung cancer because of a history of smoking.  A yearly low-dose CT scan of the lungs is recommended for people who:  Currently smoke.  Have quit within the past 15 years.  Have at least a 30-pack-year history of smoking. A pack year is smoking an average of one pack of cigarettes a day for 1 year.  Yearly screening should continue until it has been 15 years since you quit.  Yearly screening should stop if you develop a health problem that would prevent you from having lung cancer treatment.  Breast Cancer  Practice breast self-awareness. This means understanding how your breasts normally appear and feel.  It also means doing regular breast self-exams. Let your health care provider know about any changes, no matter  how small.  If you are in your 20s or 30s, you should have a clinical breast exam (CBE) by a health care provider every 1-3 years as part of a regular health exam.  If you are 51 or older, have a CBE every year. Also consider having a breast X-ray (mammogram) every year.  If you have a family history of breast cancer, talk to your health care provider about genetic screening.  If you are at high risk for breast cancer, talk to your health care provider about having an MRI and a mammogram every year.  Breast cancer gene (BRCA)  assessment is recommended for women who have family members with BRCA-related cancers. BRCA-related cancers include:  Breast.  Ovarian.  Tubal.  Peritoneal cancers.  Results of the assessment will determine the need for genetic counseling and BRCA1 and BRCA2 testing. Cervical Cancer Routine pelvic examinations to screen for cervical cancer are no longer recommended for nonpregnant women who are considered low risk for cancer of the pelvic organs (ovaries, uterus, and vagina) and who do not have symptoms. A pelvic examination may be necessary if you have symptoms including those associated with pelvic infections. Ask your health care provider if a screening pelvic exam is right for you.   The Pap test is the screening test for cervical cancer for women who are considered at risk.  If you had a hysterectomy for a problem that was not cancer or a condition that could lead to cancer, then you no longer need Pap tests.  If you are older than 65 years, and you have had normal Pap tests for the past 10 years, you no longer need to have Pap tests.  If you have had past treatment for cervical cancer or a condition that could lead to cancer, you need Pap tests and screening for cancer for at least 20 years after your treatment.  If you no longer get a Pap test, assess your risk factors if they change (such as having a new sexual partner). This can affect whether you should start being screened again.  Some women have medical problems that increase their chance of getting cervical cancer. If this is the case for you, your health care provider may recommend more frequent screening and Pap tests.  The human papillomavirus (HPV) test is another test that may be used for cervical cancer screening. The HPV test looks for the virus that can cause cell changes in the cervix. The cells collected during the Pap test can be tested for HPV.  The HPV test can be used to screen women 84 years of age and older.  Getting tested for HPV can extend the interval between normal Pap tests from three to five years.  An HPV test also should be used to screen women of any age who have unclear Pap test results.  After 47 years of age, women should have HPV testing as often as Pap tests.  Colorectal Cancer  This type of cancer can be detected and often prevented.  Routine colorectal cancer screening usually begins at 47 years of age and continues through 47 years of age.  Your health care provider may recommend screening at an earlier age if you have risk factors for colon cancer.  Your health care provider may also recommend using home test kits to check for hidden blood in the stool.  A small camera at the end of a tube can be used to examine your colon directly (sigmoidoscopy or colonoscopy). This is done  to check for the earliest forms of colorectal cancer.  Routine screening usually begins at age 50.  Direct examination of the colon should be repeated every 5-10 years through 47 years of age. However, you may need to be screened more often if early forms of precancerous polyps or small growths are found. Skin Cancer  Check your skin from head to toe regularly.  Tell your health care provider about any new moles or changes in moles, especially if there is a change in a mole's shape or color.  Also tell your health care provider if you have a mole that is larger than the size of a pencil eraser.  Always use sunscreen. Apply sunscreen liberally and repeatedly throughout the day.  Protect yourself by wearing long sleeves, pants, a wide-brimmed hat, and sunglasses whenever you are outside. HEART DISEASE, DIABETES, AND HIGH BLOOD PRESSURE   Have your blood pressure checked at least every 1-2 years. High blood pressure causes heart disease and increases the risk of stroke.  If you are between 55 years and 79 years old, ask your health care provider if you should take aspirin to prevent  strokes.  Have regular diabetes screenings. This involves taking a blood sample to check your fasting blood sugar level.  If you are at a normal weight and have a low risk for diabetes, have this test once every three years after 47 years of age.  If you are overweight and have a high risk for diabetes, consider being tested at a younger age or more often. PREVENTING INFECTION  Hepatitis B  If you have a higher risk for hepatitis B, you should be screened for this virus. You are considered at high risk for hepatitis B if:  You were born in a country where hepatitis B is common. Ask your health care provider which countries are considered high risk.  Your parents were born in a high-risk country, and you have not been immunized against hepatitis B (hepatitis B vaccine).  You have HIV or AIDS.  You use needles to inject street drugs.  You live with someone who has hepatitis B.  You have had sex with someone who has hepatitis B.  You get hemodialysis treatment.  You take certain medicines for conditions, including cancer, organ transplantation, and autoimmune conditions. Hepatitis C  Blood testing is recommended for:  Everyone born from 1945 through 1965.  Anyone with known risk factors for hepatitis C. Sexually transmitted infections (STIs)  You should be screened for sexually transmitted infections (STIs) including gonorrhea and chlamydia if:  You are sexually active and are younger than 47 years of age.  You are older than 47 years of age and your health care provider tells you that you are at risk for this type of infection.  Your sexual activity has changed since you were last screened and you are at an increased risk for chlamydia or gonorrhea. Ask your health care provider if you are at risk.  If you do not have HIV, but are at risk, it may be recommended that you take a prescription medicine daily to prevent HIV infection. This is called pre-exposure prophylaxis  (PrEP). You are considered at risk if:  You are sexually active and do not regularly use condoms or know the HIV status of your partner(s).  You take drugs by injection.  You are sexually active with a partner who has HIV. Talk with your health care provider about whether you are at high risk of being infected with   HIV. If you choose to begin PrEP, you should first be tested for HIV. You should then be tested every 3 months for as long as you are taking PrEP.  PREGNANCY   If you are premenopausal and you may become pregnant, ask your health care provider about preconception counseling.  If you may become pregnant, take 400 to 800 micrograms (mcg) of folic acid every day.  If you want to prevent pregnancy, talk to your health care provider about birth control (contraception). OSTEOPOROSIS AND MENOPAUSE   Osteoporosis is a disease in which the bones lose minerals and strength with aging. This can result in serious bone fractures. Your risk for osteoporosis can be identified using a bone density scan.  If you are 65 years of age or older, or if you are at risk for osteoporosis and fractures, ask your health care provider if you should be screened.  Ask your health care provider whether you should take a calcium or vitamin D supplement to lower your risk for osteoporosis.  Menopause may have certain physical symptoms and risks.  Hormone replacement therapy may reduce some of these symptoms and risks. Talk to your health care provider about whether hormone replacement therapy is right for you.  HOME CARE INSTRUCTIONS   Schedule regular health, dental, and eye exams.  Stay current with your immunizations.   Do not use any tobacco products including cigarettes, chewing tobacco, or electronic cigarettes.  If you are pregnant, do not drink alcohol.  If you are breastfeeding, limit how much and how often you drink alcohol.  Limit alcohol intake to no more than 1 drink per day for  nonpregnant women. One drink equals 12 ounces of beer, 5 ounces of wine, or 1 ounces of hard liquor.  Do not use street drugs.  Do not share needles.  Ask your health care provider for help if you need support or information about quitting drugs.  Tell your health care provider if you often feel depressed.  Tell your health care provider if you have ever been abused or do not feel safe at home. Document Released: 11/29/2010 Document Revised: 09/30/2013 Document Reviewed: 04/17/2013 [employer] Patient Information 2015 [employer]. This information is not intended to replace advice given to you by your health care provider. Make sure you discuss any questions you have with your health care provider.  

## 2016-02-09 ENCOUNTER — Other Ambulatory Visit: Payer: Self-pay | Admitting: Gynecology

## 2016-02-09 DIAGNOSIS — E7800 Pure hypercholesterolemia, unspecified: Secondary | ICD-10-CM

## 2016-02-09 LAB — URINALYSIS W MICROSCOPIC + REFLEX CULTURE
Bacteria, UA: NONE SEEN [HPF]
Bilirubin Urine: NEGATIVE
CASTS: NONE SEEN [LPF]
Glucose, UA: NEGATIVE
Ketones, ur: NEGATIVE
Leukocytes, UA: NEGATIVE
Nitrite: NEGATIVE
PROTEIN: NEGATIVE
Specific Gravity, Urine: 1.026 (ref 1.001–1.035)
WBC, UA: NONE SEEN WBC/HPF (ref <=5)
YEAST: NONE SEEN [HPF]
pH: 5.5 (ref 5.0–8.0)

## 2016-02-09 LAB — THYROID PROFILE - CHCC
FREE THYROXINE INDEX: 2.7 (ref 1.4–3.8)
T3 Uptake: 31 % (ref 22–35)
T4 TOTAL: 8.7 ug/dL (ref 4.5–12.0)

## 2016-02-09 LAB — FOLLICLE STIMULATING HORMONE: FSH: 2.6 m[IU]/mL

## 2016-02-09 LAB — ANA: ANA: NEGATIVE

## 2016-02-10 LAB — URINE CULTURE: Organism ID, Bacteria: NO GROWTH

## 2016-04-01 ENCOUNTER — Other Ambulatory Visit: Payer: Self-pay | Admitting: Family Medicine

## 2016-04-01 DIAGNOSIS — Z1231 Encounter for screening mammogram for malignant neoplasm of breast: Secondary | ICD-10-CM

## 2016-04-18 ENCOUNTER — Ambulatory Visit
Admission: RE | Admit: 2016-04-18 | Discharge: 2016-04-18 | Disposition: A | Discharge status: Home / Self Care | Payer: 59 | Source: Ambulatory Visit | Attending: Family Medicine | Admitting: Family Medicine

## 2016-04-18 DIAGNOSIS — Z1231 Encounter for screening mammogram for malignant neoplasm of breast: Secondary | ICD-10-CM

## 2016-07-04 DIAGNOSIS — Z0000 Encounter for general adult medical examination without abnormal findings: Secondary | ICD-10-CM | POA: Diagnosis not present

## 2016-07-19 DIAGNOSIS — R300 Dysuria: Secondary | ICD-10-CM | POA: Diagnosis not present

## 2016-07-19 DIAGNOSIS — R350 Frequency of micturition: Secondary | ICD-10-CM | POA: Diagnosis not present

## 2016-07-19 DIAGNOSIS — R102 Pelvic and perineal pain: Secondary | ICD-10-CM | POA: Diagnosis not present

## 2016-10-31 DIAGNOSIS — M545 Low back pain: Secondary | ICD-10-CM | POA: Diagnosis not present

## 2017-01-03 DIAGNOSIS — G8929 Other chronic pain: Secondary | ICD-10-CM | POA: Diagnosis not present

## 2017-01-03 DIAGNOSIS — M533 Sacrococcygeal disorders, not elsewhere classified: Secondary | ICD-10-CM | POA: Diagnosis not present

## 2017-01-03 DIAGNOSIS — M545 Low back pain: Secondary | ICD-10-CM | POA: Diagnosis not present

## 2017-02-09 ENCOUNTER — Encounter: Payer: 59 | Admitting: Gynecology

## 2017-02-27 DIAGNOSIS — J302 Other seasonal allergic rhinitis: Secondary | ICD-10-CM | POA: Diagnosis not present

## 2017-02-27 DIAGNOSIS — M62838 Other muscle spasm: Secondary | ICD-10-CM | POA: Diagnosis not present

## 2017-03-23 ENCOUNTER — Encounter: Payer: Self-pay | Admitting: Gynecology

## 2017-05-08 ENCOUNTER — Ambulatory Visit: Payer: Self-pay | Admitting: Gynecology

## 2017-05-10 ENCOUNTER — Ambulatory Visit: Payer: 59 | Admitting: Gynecology

## 2017-05-10 ENCOUNTER — Encounter: Payer: Self-pay | Admitting: Gynecology

## 2017-05-10 ENCOUNTER — Other Ambulatory Visit: Payer: Self-pay | Admitting: Gynecology

## 2017-05-10 VITALS — BP 118/74

## 2017-05-10 DIAGNOSIS — N898 Other specified noninflammatory disorders of vagina: Secondary | ICD-10-CM | POA: Diagnosis not present

## 2017-05-10 DIAGNOSIS — N644 Mastodynia: Secondary | ICD-10-CM

## 2017-05-10 DIAGNOSIS — Z1231 Encounter for screening mammogram for malignant neoplasm of breast: Secondary | ICD-10-CM

## 2017-05-10 LAB — WET PREP FOR TRICH, YEAST, CLUE

## 2017-05-10 MED ORDER — FLUCONAZOLE 150 MG PO TABS: 150.0000 mg | ORAL_TABLET | Freq: Once | ORAL | 0 refills | Status: AC

## 2017-05-10 NOTE — Patient Instructions (Signed)
Take the one Diflucan pill.  Follow-up if your vaginal irritation symptoms persist.  Schedule your mammogram.

## 2017-05-10 NOTE — Addendum Note (Signed)
Addended by: Nelva Nay on: 05/10/2017 12:24 PM   Modules accepted: Orders

## 2017-05-10 NOTE — Progress Notes (Signed)
    Gina Pratt 1971-04-02 [id number]    46 y.o.  [id number] presents with 2 complaints:  1. Intermittent bilateral generalized breast tenderness over the past year.  Comes and goes.  Does not seem particularly linked to her menses.  No masses felt on self-exam.  No nipple discharge.  Last mammogram 1 year ago. 2. Vaginal irritation with discharge and some vulvar erythema on self exam last week.  Used OTC Monistat.  Notes improvement in her symptoms but still a nagging irritation and discharge.  No odor.  No urinary symptoms such as frequency dysuria urgency low back pain fever or chills.  Past medical history,surgical history, problem list, medications, allergies, family history and social history were all reviewed and documented in the EPIC chart.  Directed ROS with pertinent positives and negatives documented in the history of present illness/assessment and plan.  Exam: Caryn Bee assistant Vitals:   05/10/17 1030  BP: 118/74   General appearance:  Normal Both breasts examined lying and sitting without masses, retractions, discharge, adenopathy. Abdomen soft nontender without masses guarding rebound Pelvic external BUS vagina normal  With scant discharge.  Cervix normal.  Uterus normal size midline mobile nontender.  Adnexa without masses or tenderness.  Assessment/Plan:  46 y.o. [id number] with:  1. Bilateral breast tenderness comes and goes over the past year or so consistent with physiologic changes.  Exam is normal.  Recommend patient get her baseline mammogram now.  Otherwise will monitor symptoms and as long as intermittent with no persistent discomfort and no palpable abnormalities on self breast exam will monitor.  If persistent discomfort or certainly anything palpable she will represent for further evaluation. 2. Vaginal discharge with irritation.  Used Monistat 1 week ago.  Wet prep is unremarkable.  I think given her picture she has a partially treated yeast vaginitis.  I  am going to cover her with Diflucan 150 mg x1 dose.  She will follow-up if her symptoms persist, worsen or recur.      Anastasio Auerbach MD, 10:40 AM 05/10/2017

## 2017-05-11 ENCOUNTER — Ambulatory Visit
Admission: RE | Admit: 2017-05-11 | Discharge: 2017-05-11 | Disposition: A | Discharge status: Home / Self Care | Payer: 59 | Source: Ambulatory Visit | Attending: Gynecology | Admitting: Gynecology

## 2017-05-11 DIAGNOSIS — Z1231 Encounter for screening mammogram for malignant neoplasm of breast: Secondary | ICD-10-CM | POA: Diagnosis not present

## 2017-06-07 ENCOUNTER — Encounter: Payer: Self-pay | Admitting: Gynecology

## 2017-06-07 DIAGNOSIS — Z0289 Encounter for other administrative examinations: Secondary | ICD-10-CM

## 2017-07-07 DIAGNOSIS — Z0000 Encounter for general adult medical examination without abnormal findings: Secondary | ICD-10-CM | POA: Diagnosis not present

## 2017-07-07 DIAGNOSIS — E559 Vitamin D deficiency, unspecified: Secondary | ICD-10-CM | POA: Diagnosis not present

## 2017-07-07 DIAGNOSIS — J309 Allergic rhinitis, unspecified: Secondary | ICD-10-CM | POA: Diagnosis not present

## 2017-07-27 DIAGNOSIS — M25512 Pain in left shoulder: Secondary | ICD-10-CM | POA: Diagnosis not present

## 2017-07-27 DIAGNOSIS — M5412 Radiculopathy, cervical region: Secondary | ICD-10-CM | POA: Diagnosis not present

## 2017-07-27 DIAGNOSIS — M79602 Pain in left arm: Secondary | ICD-10-CM | POA: Diagnosis not present

## 2017-08-21 DIAGNOSIS — G5602 Carpal tunnel syndrome, left upper limb: Secondary | ICD-10-CM | POA: Diagnosis not present

## 2017-09-26 DIAGNOSIS — G5602 Carpal tunnel syndrome, left upper limb: Secondary | ICD-10-CM | POA: Diagnosis not present

## 2017-11-14 DIAGNOSIS — H353131 Nonexudative age-related macular degeneration, bilateral, early dry stage: Secondary | ICD-10-CM | POA: Diagnosis not present

## 2017-11-14 DIAGNOSIS — H10413 Chronic giant papillary conjunctivitis, bilateral: Secondary | ICD-10-CM | POA: Diagnosis not present

## 2017-11-14 DIAGNOSIS — H40013 Open angle with borderline findings, low risk, bilateral: Secondary | ICD-10-CM | POA: Diagnosis not present

## 2017-11-21 NOTE — Progress Notes (Signed)
[facility] Note  11/22/2017     CHIEF COMPLAINT Patient presents for Retina Evaluation   HISTORY OF PRESENT ILLNESS: Gina Pratt is a 47 y.o. female who presents to the clinic today for:   HPI    Retina Evaluation    In both eyes.  This started 1 week ago.  Associated Symptoms Negative for Flashes, Blind Spot, Photophobia, Scalp Tenderness, Fever, Weight Loss, Jaw Claudication, Glare, Pain, Floaters, Distortion, Redness, Trauma, Shoulder/Hip pain and Fatigue.  Context:  distance vision, mid-range vision and near vision.  Treatments tried include no treatments.  I, the attending physician,  performed the HPI with the patient and updated documentation appropriately.          Comments    Pt presents on the referral of Dr. 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 Katy Fitch for the first time on June 18, previously she had been seeing Dr. Constance Haw for 10-12 year, she states she has not had any vision problems, she wears contact lenses and states her rx is +9.50, pt is not experiencing blurry vision, flashes, floaters, pain or wavy vision, pt denies the use of gtts, pt denies being diabetic, pt has seasonal allergies and work on a computer for long hours, pt is concerned about a mole next to her eye that just come up and itches,        Last edited by Bernarda Caffey, MD on 11/22/2017 10:35 PM. (History)    Pt states she was seen by Dr. Katy Fitch for routine exam; Pt states Dr. Katy Fitch then sent her to Dr. Coralyn Pear; Pt states she has a "high glasses prescription"; Pt states since being a child she has worn specs and CTL; Pt denies any family hx of any ocular disease; Pt denies dx of DM or HTN;   Referring physician: Debbra Riding, MD [address] [place], [place] [address]  HISTORICAL INFORMATION:   Selected notes from the MEDICAL RECORD NUMBER Referred by Dr. 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 Groat) [BCVA: OD: 20/30 OS:  20/25 Ocular Hx-Glaucoma suspect OU, Allergic Conjunctivitis OU, non-exu ARMD OU PMH-High Cholesterol    CURRENT MEDICATIONS: No current outpatient medications on file. (Ophthalmic Drugs)   No current facility-administered medications for this visit.  (Ophthalmic Drugs)   Current Outpatient Medications (Other)  Medication Sig  . fluconazole (DIFLUCAN) 150 MG tablet fluconazole 150 mg tablet  . fluticasone (FLONASE) 50 MCG/ACT nasal spray as needed.  . IBUPROFEN PO Take by mouth.   No current facility-administered medications for this visit.  (Other)      REVIEW OF SYSTEMS: ROS    Positive for: Cardiovascular, Eyes   Negative for: Constitutional, Gastrointestinal, Neurological, Skin, Genitourinary, Musculoskeletal, HENT, Endocrine, Respiratory, Psychiatric, Allergic/Imm, Heme/Lymph   Last edited by Debbrah Alar, COT on 11/22/2017  8:12 AM. (History)       ALLERGIES No Known Allergies  PAST MEDICAL HISTORY Past Medical History:  Diagnosis Date  . Elevated cholesterol   . Hyperlipidemia   . Seasonal allergies    Past Surgical History:  Procedure Laterality Date  . DILATION AND CURETTAGE OF UTERUS    . TUBAL LIGATION  2009   Falope ring    FAMILY HISTORY Family History  Problem Relation Age of Onset  . Hypertension Mother   . Osteoporosis Mother   . Hepatitis Father   . Diabetes Paternal Grandfather   . Breast cancer Neg Hx   .  Amblyopia Neg Hx   . Blindness Neg Hx   . Cataracts Neg Hx   . Glaucoma Neg Hx   . Macular degeneration Neg Hx   . Retinal detachment Neg Hx   . Strabismus Neg Hx   . Retinitis pigmentosa Neg Hx     SOCIAL HISTORY Social History   Tobacco Use  . Smoking status: Former Research scientist (life sciences)  . Smokeless tobacco: Never Used  Substance Use Topics  . Alcohol use: Yes    Comment: social  . Drug use: No         OPHTHALMIC EXAM:  Base Eye Exam    Visual Acuity (Snellen - Linear)      Right Left   Dist cc 20/30 -2 20/30 -2   Dist ph  cc 20/30 +2 20/25 -2   Correction:  Contacts       Tonometry (Tonopen, 8:24 AM)      Right Left   Pressure 15 19       Pupils      Dark Light Shape React APD   Right 4 2 Round Brisk None   Left 4 2 Round Brisk None       Visual Fields (Counting fingers)      Left Right    Full Full       Extraocular Movement      Right Left    Full, Ortho Full, Ortho       Neuro/Psych    Oriented x3:  Yes   Mood/Affect:  Normal       Dilation    Both eyes:  1.0% Mydriacyl, 2.5% Phenylephrine @ 8:24 AM        Slit Lamp and Fundus Exam    Slit Lamp Exam      Right Left   Lids/Lashes Normal; Small pigmented papilloma on temporal lower lid Normal   Conjunctiva/Sclera White and quiet White and quiet   Cornea Arcus Arcus   Anterior Chamber Deep and quiet, no cell or flare Deep and quiet, no cell or flare   Iris Round and dilated Round and dilated   Lens 1+ Nuclear sclerosis, 1+ Cortical cataract 1+ Nuclear sclerosis, 1+ Cortical cataract   Vitreous Normal Normal       Fundus Exam      Right Left   Disc Pink and Sharp, mild temporal thinning Pink and Sharp, temporla rim thinning, focal area of whitening of NFL   C/D Ratio 0.55 0.5   Macula Flat, Drusen/Retinal pigment epithelial mottling, No heme or edema Flat, mild Retinal pigment epithelial mottling, Drusen   Vessels Normal Normal   Periphery Attached Attached        Refraction    Manifest Refraction      Sphere Cylinder Axis Dist VA   Right +8.00 +0.75 079    Left +8.00 +0.50 115        Manifest Refraction #2      Sphere Cylinder Axis Dist VA   Right +7.50 +0.75 079 20/25-1   Left +7.50 +1.50 120 20/25-1          IMAGING AND PROCEDURES  Imaging and Procedures for @TODAY @  OCT, Retina - OU - Both Eyes       Right Eye Quality was good. Central Foveal Thickness: 236. Progression has no prior data. Findings include normal foveal contour, no IRF, no SRF, retinal drusen .   Left Eye Quality was good. Central  Foveal Thickness: 236. Progression has no prior data. Findings include normal foveal contour,  no IRF, retinal drusen , no SRF.   Notes *Images captured and stored on drive  Diagnosis / Impression:  NFP, No IRF/SRF with retinal drusen OU  Clinical management:  See below  Abbreviations: NFP - Normal foveal profile. CME - cystoid macular edema. PED - pigment epithelial detachment. IRF - intraretinal fluid. SRF - subretinal fluid. EZ - ellipsoid zone. ERM - epiretinal membrane. ORA - outer retinal atrophy. ORT - outer retinal tubulation. SRHM - subretinal hyper-reflective material                  ASSESSMENT/PLAN:    ICD-10-CM   1. 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 Retinal edema H35.81 OCT, Retina - OU - Both Eyes  3. 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 Retinal Drusen OU-  - scattered retinal drusen OU - too young to be ARMD and not dense enough to consider a pattern dystrophy - no intervention indicated at this time - recommend monitoring - F/U 6 months, DFE, OCT  2. 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 Hyperopia w/ astigmatism - high hyperopia with MRx +8.00 OU - CL wearer - monitor  5. Combined form age related cataract OU-  - The symptoms of cataract, surgical options, and treatments and risks were discussed with patient. - discussed diagnosis and progression - not yet visually significant - monitor for now  6. RLL papilloma  - pt reports intermittent irritation and is considering removal - pt to discuss lesion with Dr. Katy Fitch   Ophthalmic Meds Ordered this visit:  No orders of the defined types were placed in this encounter.      Return in about 6 months (around 05/24/2018) for F/U retinal drusen OU, DFE, OCT.  There are no Patient Instructions on  file for this visit.   Explained the diagnoses, plan, and follow up with the patient and they expressed understanding.  Patient expressed understanding of the importance of proper follow up care.   This document serves as a record of services personally performed by Gardiner Sleeper, MD, PhD. It was created on their behalf by Ernest Mallick, OA, an ophthalmic assistant. The creation of this record is the provider's dictation and/or activities during the visit.    Electronically signed by: Ernest Mallick, OA  06.25.2019 10:37 PM   This document serves as a record of services personally performed by Gardiner Sleeper, MD, PhD. It was created on their behalf by Catha Brow, [place], a certified ophthalmic assistant. The creation of this record is the provider's dictation and/or activities during the visit.  Electronically signed by: Catha Brow, COA  06.26.19 10:37 PM     Gardiner Sleeper, M.D., Ph.D. Diseases & Surgery of the Retina and Vitreous [facility]  I have reviewed the above documentation for accuracy and completeness, and I agree with the above. Gardiner Sleeper, M.D., Ph.D. 11/22/17 10:48 PM   Abbreviations: M myopia (nearsighted); A astigmatism; H hyperopia (farsighted); P presbyopia; Mrx spectacle prescription;  CTL contact lenses; OD right eye; OS left eye; OU both eyes  XT exotropia; ET esotropia; PEK punctate epithelial keratitis; PEE punctate epithelial erosions; DES dry eye syndrome; MGD meibomian gland dysfunction; ATs artificial tears; PFAT's preservative free  artificial tears; [place] nuclear sclerotic cataract; PSC posterior subcapsular cataract; ERM epi-retinal membrane; PVD posterior vitreous detachment; RD retinal detachment; DM diabetes mellitus; DR diabetic retinopathy; NPDR non-proliferative diabetic retinopathy; PDR proliferative diabetic retinopathy; CSME clinically significant macular edema; DME diabetic macular edema; dbh dot blot hemorrhages; CWS  cotton wool spot; POAG primary open angle glaucoma; C/D cup-to-disc ratio; HVF humphrey visual field; GVF goldmann visual field; OCT optical coherence tomography; IOP intraocular pressure; BRVO Branch retinal vein occlusion; CRVO central retinal vein occlusion; CRAO central retinal artery occlusion; BRAO branch retinal artery occlusion; RT retinal tear; SB scleral buckle; PPV pars plana vitrectomy; VH Vitreous hemorrhage; PRP panretinal laser photocoagulation; IVK intravitreal kenalog; VMT vitreomacular traction; MH Macular hole;  NVD neovascularization of the disc; NVE neovascularization elsewhere; AREDS age related eye disease study; ARMD age related macular degeneration; POAG primary open angle glaucoma; EBMD epithelial/anterior basement membrane dystrophy; ACIOL anterior chamber intraocular lens; IOL intraocular lens; PCIOL posterior chamber intraocular lens; Phaco/IOL phacoemulsification with intraocular lens placement; [place] photorefractive keratectomy; LASIK laser assisted in situ keratomileusis; HTN hypertension; DM diabetes mellitus; COPD chronic obstructive pulmonary disease

## 2017-11-22 ENCOUNTER — Encounter (INDEPENDENT_AMBULATORY_CARE_PROVIDER_SITE_OTHER): Payer: Self-pay | Admitting: Ophthalmology

## 2017-11-22 ENCOUNTER — Ambulatory Visit (INDEPENDENT_AMBULATORY_CARE_PROVIDER_SITE_OTHER): Payer: 59 | Admitting: Ophthalmology

## 2017-11-22 DIAGNOSIS — H25813 Combined forms of age-related cataract, bilateral: Secondary | ICD-10-CM

## 2017-11-22 DIAGNOSIS — D369 Benign neoplasm, unspecified site: Secondary | ICD-10-CM | POA: Diagnosis not present

## 2017-11-22 DIAGNOSIS — H5203 Hypermetropia, bilateral: Secondary | ICD-10-CM | POA: Diagnosis not present

## 2017-11-22 DIAGNOSIS — H35363 Drusen (degenerative) of macula, bilateral: Secondary | ICD-10-CM | POA: Diagnosis not present

## 2017-11-22 DIAGNOSIS — H52203 Unspecified astigmatism, bilateral: Secondary | ICD-10-CM

## 2017-11-22 DIAGNOSIS — H359 Unspecified retinal disorder: Secondary | ICD-10-CM | POA: Diagnosis not present

## 2017-11-22 DIAGNOSIS — H3581 Retinal edema: Secondary | ICD-10-CM | POA: Diagnosis not present

## 2017-12-14 DIAGNOSIS — H40013 Open angle with borderline findings, low risk, bilateral: Secondary | ICD-10-CM | POA: Diagnosis not present

## 2017-12-14 DIAGNOSIS — H10413 Chronic giant papillary conjunctivitis, bilateral: Secondary | ICD-10-CM | POA: Diagnosis not present

## 2017-12-14 DIAGNOSIS — H353131 Nonexudative age-related macular degeneration, bilateral, early dry stage: Secondary | ICD-10-CM | POA: Diagnosis not present

## 2018-04-03 DIAGNOSIS — B078 Other viral warts: Secondary | ICD-10-CM | POA: Diagnosis not present

## 2018-04-25 DIAGNOSIS — D23112 Other benign neoplasm of skin of right lower eyelid, including canthus: Secondary | ICD-10-CM | POA: Diagnosis not present

## 2018-04-25 DIAGNOSIS — D485 Neoplasm of uncertain behavior of skin: Secondary | ICD-10-CM | POA: Diagnosis not present

## 2018-05-09 DIAGNOSIS — D485 Neoplasm of uncertain behavior of skin: Secondary | ICD-10-CM | POA: Diagnosis not present

## 2018-05-09 DIAGNOSIS — B078 Other viral warts: Secondary | ICD-10-CM | POA: Diagnosis not present

## 2018-06-01 DIAGNOSIS — R69 Illness, unspecified: Secondary | ICD-10-CM | POA: Diagnosis not present

## 2018-06-01 DIAGNOSIS — R52 Pain, unspecified: Secondary | ICD-10-CM | POA: Diagnosis not present

## 2018-06-01 DIAGNOSIS — J0110 Acute frontal sinusitis, unspecified: Secondary | ICD-10-CM | POA: Diagnosis not present

## 2018-06-05 ENCOUNTER — Encounter (INDEPENDENT_AMBULATORY_CARE_PROVIDER_SITE_OTHER): Payer: 59 | Admitting: Ophthalmology

## 2018-06-13 ENCOUNTER — Other Ambulatory Visit: Payer: Self-pay | Admitting: Family Medicine

## 2018-06-13 ENCOUNTER — Other Ambulatory Visit: Payer: Self-pay | Admitting: Gynecology

## 2018-06-13 DIAGNOSIS — Z1231 Encounter for screening mammogram for malignant neoplasm of breast: Secondary | ICD-10-CM

## 2018-06-28 ENCOUNTER — Ambulatory Visit: Payer: 59

## 2018-07-03 ENCOUNTER — Ambulatory Visit: Payer: 59 | Admitting: Gynecology

## 2018-07-03 ENCOUNTER — Encounter: Payer: Self-pay | Admitting: Gynecology

## 2018-07-03 VITALS — BP 118/74 | Temp 98.0°F

## 2018-07-03 DIAGNOSIS — R1030 Lower abdominal pain, unspecified: Secondary | ICD-10-CM

## 2018-07-03 LAB — CBC WITH DIFFERENTIAL/PLATELET
Absolute Monocytes: 408 cells/uL (ref 200–950)
Basophils Absolute: 48 cells/uL (ref 0–200)
Basophils Relative: 0.7 %
EOS PCT: 2.1 %
Eosinophils Absolute: 143 cells/uL (ref 15–500)
HCT: 38.9 % (ref 35.0–45.0)
Hemoglobin: 13.1 g/dL (ref 11.7–15.5)
Lymphs Abs: 2142 cells/uL (ref 850–3900)
MCH: 28.9 pg (ref 27.0–33.0)
MCHC: 33.7 g/dL (ref 32.0–36.0)
MCV: 85.9 fL (ref 80.0–100.0)
MONOS PCT: 6 %
MPV: 12.6 fL — ABNORMAL HIGH (ref 7.5–12.5)
Neutro Abs: 4060 cells/uL (ref 1500–7800)
Neutrophils Relative %: 59.7 %
Platelets: 199 10*3/uL (ref 140–400)
RBC: 4.53 10*6/uL (ref 3.80–5.10)
RDW: 13.1 % (ref 11.0–15.0)
TOTAL LYMPHOCYTE: 31.5 %
WBC: 6.8 10*3/uL (ref 3.8–10.8)

## 2018-07-03 LAB — PREGNANCY, URINE: PREG TEST UR: NEGATIVE

## 2018-07-03 NOTE — Addendum Note (Signed)
Addended by: Nelva Nay on: 07/03/2018 11:54 AM   Modules accepted: Orders

## 2018-07-03 NOTE — Progress Notes (Signed)
    SANDIA PFUND Jan 08, 1971 [id number]        47 y.o.  [id number] presents complaining of several days of lower abdominal pain.  Initially started in the left side and now is more generalized in the lower abdomen.  Had a bowel movement this morning with a lot of discomfort.  Having regular bowel movements almost daily.  No frequency dysuria urgency low back pain fever or chills.  Some nausea but no vomiting.  Eating regular meals daily.  Menses somewhat sporadic with some menses light and others regular.  Tubal sterilization.  Past medical history,surgical history, problem list, medications, allergies, family history and social history were all reviewed and documented in the EPIC chart.  Directed ROS with pertinent positives and negatives documented in the history of present illness/assessment and plan.  Exam: Caryn Bee assistant Vitals:   07/03/18 0933  BP: 118/74  Temp 36.7C (64F) General appearance:  Normal Spine straight without CVA tenderness Abdomen soft with mild diffuse lower abdominal discomfort.  No masses guarding rebound Pelvic external BUS vagina normal.  Cervix normal.  Uterus normal size midline mobile nontender.  Adnexa without masses or tenderness.  Rectal exam normal.  Assessment/Plan:  48 y.o. [id number] with several days of lower abdominal discomfort.  Painful bowel movement this morning.  Exam shows some tenderness to deep palpation but no localizing signs.  Afebrile.  Reviewed differential to include GYN, GU and GI.  Urinalysis is unremarkable noting no significant RBCs or WBCs.  UPT is negative.  Check CBC with differential.  Check GYN ultrasound rule out nonpalpable abnormalities.  Push fluids with low residue diet.  Follow-up if pain worsens or changes.  Will refer to GI if pain persists and ultrasound negative.    Anastasio Auerbach MD, 10:30 AM 07/03/2018

## 2018-07-03 NOTE — Patient Instructions (Signed)
Follow-up for review ultrasound as scheduled

## 2018-07-05 LAB — URINALYSIS, COMPLETE W/RFL CULTURE
Bacteria, UA: NONE SEEN /HPF
Bilirubin Urine: NEGATIVE
Glucose, UA: NEGATIVE
Hyaline Cast: NONE SEEN /LPF
Ketones, ur: NEGATIVE
Leukocyte Esterase: NEGATIVE
Nitrites, Initial: NEGATIVE
Protein, ur: NEGATIVE
Specific Gravity, Urine: 1.02 (ref 1.001–1.03)
WBC, UA: NONE SEEN /HPF (ref 0–5)
pH: 6.5 (ref 5.0–8.0)

## 2018-07-05 LAB — URINE CULTURE
MICRO NUMBER:: 149138
RESULT: NO GROWTH
SPECIMEN QUALITY:: ADEQUATE

## 2018-07-05 LAB — CULTURE INDICATED

## 2018-07-06 ENCOUNTER — Telehealth: Payer: Self-pay

## 2018-07-06 NOTE — Telephone Encounter (Signed)
Okay for referral to [facility].  Still needs to have her ultrasound

## 2018-07-06 NOTE — Telephone Encounter (Signed)
Patient informed referral in process and someone will call her to schedule.

## 2018-07-06 NOTE — Telephone Encounter (Signed)
Routed to [place] to handle referral.

## 2018-07-06 NOTE — Telephone Encounter (Signed)
Patient called to check on the status if GI MD referral. I told her that your note had mentioned if pain persisted and ultrasound was negative then you would refer her.   U/s is not scheduled until 07/13/18 and her pain is still the same. She would like to get GI referral if okay.

## 2018-07-09 ENCOUNTER — Encounter: Payer: Self-pay | Admitting: Physician Assistant

## 2018-07-09 ENCOUNTER — Telehealth: Payer: Self-pay | Admitting: *Deleted

## 2018-07-09 DIAGNOSIS — R1030 Lower abdominal pain, unspecified: Secondary | ICD-10-CM

## 2018-07-09 NOTE — Telephone Encounter (Signed)
-----   Message from Ramond Craver, [place] sent at 07/06/2018  9:55 AM EST ----- Regarding: referral to GI See her last visit. Pain persists. TF said okay to go ahead with referral. She has gyn u/s 07/13/18.  Thanks

## 2018-07-09 NOTE — Telephone Encounter (Signed)
Referral placed at  GI they will call to schedule.  

## 2018-07-11 NOTE — Telephone Encounter (Signed)
Patient on 07/18/18 with Dr.Esterwood

## 2018-07-12 ENCOUNTER — Ambulatory Visit (INDEPENDENT_AMBULATORY_CARE_PROVIDER_SITE_OTHER): Payer: 59

## 2018-07-12 ENCOUNTER — Encounter: Payer: Self-pay | Admitting: Gynecology

## 2018-07-12 ENCOUNTER — Ambulatory Visit: Payer: 59 | Admitting: Gynecology

## 2018-07-12 VITALS — BP 130/80

## 2018-07-12 DIAGNOSIS — R109 Unspecified abdominal pain: Secondary | ICD-10-CM | POA: Diagnosis not present

## 2018-07-12 DIAGNOSIS — R1030 Lower abdominal pain, unspecified: Secondary | ICD-10-CM

## 2018-07-12 DIAGNOSIS — K5900 Constipation, unspecified: Secondary | ICD-10-CM | POA: Diagnosis not present

## 2018-07-12 DIAGNOSIS — D259 Leiomyoma of uterus, unspecified: Secondary | ICD-10-CM

## 2018-07-12 NOTE — Patient Instructions (Signed)
Follow-up with the gastroenterology doctors if your constipation and abdominal pain continue to be an issue.

## 2018-07-12 NOTE — Progress Notes (Signed)
    Gina Pratt 1971/02/20 [id number]        48 y.o.  [id number] resents for ultrasound.  History of lower abdominal discomfort.  Exam was negative.  CBC was normal to include normal white count and hemoglobin.  Urine analysis was negative.  The patient notes that her pain is somewhat better.  She is having issues with constipation.  Past medical history,surgical history, problem list, medications, allergies, family history and social history were all reviewed and documented in the EPIC chart.  Directed ROS with pertinent positives and negatives documented in the history of present illness/assessment and plan.  Exam: Vitals:   07/12/18 0835  BP: 130/80   General appearance:  Normal  Ultrasound transvaginal shows uterus normal size.  Small intramural myoma noted 18 x 16 mm.  Secretory phase endometrium with endometrial echo 13 mm.  Right and left ovaries normal.  Small right adnexal cyst 15 x 10 mm consistent with paratubal cyst.  Cul-de-sac negative.  Assessment/Plan:  48 y.o. [id number] with lower abdominal pain getting better.  History of constipation.  Ultrasound unremarkable as explanation for her pain.  We discussed her constipation and recommended starting on fiber based products such as Metamucil or FiberCon.  Assuming that her pain resolves then she will follow expectantly.  If her pain would persist or she persists to have issues with constipation she will follow-up with her gastroenterologist.  Also discussed recommendations for screening colonoscopy at age 41 as she is approaching this.    Anastasio Auerbach MD, 8:52 AM 07/12/2018

## 2018-07-18 ENCOUNTER — Ambulatory Visit: Payer: 59 | Admitting: Physician Assistant

## 2018-07-25 ENCOUNTER — Ambulatory Visit: Payer: 59

## 2018-07-27 DIAGNOSIS — M549 Dorsalgia, unspecified: Secondary | ICD-10-CM | POA: Diagnosis not present

## 2018-07-27 DIAGNOSIS — S199XXA Unspecified injury of neck, initial encounter: Secondary | ICD-10-CM | POA: Diagnosis not present

## 2018-08-14 ENCOUNTER — Encounter: Payer: 59 | Admitting: Gynecology

## 2018-08-24 ENCOUNTER — Ambulatory Visit: Payer: 59

## 2018-09-18 ENCOUNTER — Encounter: Payer: 59 | Admitting: Gynecology

## 2018-09-26 ENCOUNTER — Other Ambulatory Visit: Payer: Self-pay

## 2018-09-26 ENCOUNTER — Ambulatory Visit: Payer: 59

## 2018-09-27 ENCOUNTER — Encounter: Payer: Self-pay | Admitting: Gynecology

## 2018-09-27 ENCOUNTER — Ambulatory Visit: Payer: 59 | Admitting: Gynecology

## 2018-09-27 VITALS — BP 118/76 | Ht 60.0 in | Wt 162.0 lb

## 2018-09-27 DIAGNOSIS — Z01419 Encounter for gynecological examination (general) (routine) without abnormal findings: Secondary | ICD-10-CM | POA: Diagnosis not present

## 2018-09-27 DIAGNOSIS — N951 Menopausal and female climacteric states: Secondary | ICD-10-CM | POA: Diagnosis not present

## 2018-09-27 DIAGNOSIS — R87618 Other abnormal cytological findings on specimens from cervix uteri: Secondary | ICD-10-CM | POA: Diagnosis not present

## 2018-09-27 NOTE — Addendum Note (Signed)
Addended by: Nelva Nay on: 09/27/2018 12:18 PM   Modules accepted: Orders

## 2018-09-27 NOTE — Patient Instructions (Signed)
Follow-up in 1 year for annual exam, sooner as needed. 

## 2018-09-27 NOTE — Progress Notes (Signed)
    Gina Pratt November 01, 1970 [id number]        48 y.o.  [id number] for annual gynecologic exam.  Continues with monthly menses.  Has noticed some slight increase in hot flashes sweats and moodiness.  Past medical history,surgical history, problem list, medications, allergies, family history and social history were all reviewed and documented as reviewed in the EPIC chart.  ROS:  Performed with pertinent positives and negatives included in the history, assessment and plan.   Additional significant findings : None   Exam: Caryn Bee assistant Vitals:   09/27/18 1132  BP: 118/76  Weight: 162 lb (73.5 kg)  Height: 5' (1.524 m)   Body mass index is 31.64 kg/m.  General appearance:  Normal affect, orientation and appearance. Skin: Grossly normal HEENT: Without gross lesions.  No cervical or supraclavicular adenopathy. Thyroid normal.  Lungs:  Clear without wheezing, rales or rhonchi Cardiac: RR, without RMG Abdominal:  Soft, nontender, without masses, guarding, rebound, organomegaly or hernia Breasts:  Examined lying and sitting without masses, retractions, discharge or axillary adenopathy. Pelvic:  Ext, BUS, Vagina: Normal  Cervix: Normal.  Pap smear/HPV  Uterus: Anteverted, normal size, shape and contour, midline and mobile nontender   Adnexa: Without masses or tenderness    Anus and perineum: Normal   Rectovaginal: Normal sphincter tone without palpated masses or tenderness.    Assessment/Plan:  48 y.o. [id number] female for annual gynecologic exam.  With regular menses, tubal sterilization  1. Perimenopause.  Patient continues with regular menses.  She does have some hot flushes and sweats as well as some moodiness.  We discussed options in the perimenopause to include observation, OTC products, HRT and pharmacologic non-HRT such as Effexor.  At this point the patient is going to monitor.  Will follow-up if significant irregularity in her cycles or more significant menopausal  symptoms develop to rediscuss evaluation and treatment options. 2. Mammography scheduled and patient will follow-up for this.  Breast exam normal today. 3. Pap smear/HPV 2015.  Pap smear/HPV today.  No history of abnormal Pap smears previously. 4. Health maintenance.  No routine lab work done as patient does this elsewhere.  Follow-up 1 year, sooner as needed.   Anastasio Auerbach MD, 12:07 PM 09/27/2018

## 2018-09-28 ENCOUNTER — Encounter: Payer: 59 | Admitting: Gynecology

## 2018-09-28 LAB — PAP IG AND HPV HIGH-RISK: HPV DNA High Risk: NOT DETECTED

## 2018-11-15 ENCOUNTER — Ambulatory Visit: Payer: 59

## 2018-12-27 ENCOUNTER — Ambulatory Visit: Payer: 59

## 2019-02-08 ENCOUNTER — Ambulatory Visit: Payer: 59

## 2019-02-20 ENCOUNTER — Encounter: Payer: Self-pay | Admitting: Gynecology

## 2019-06-03 ENCOUNTER — Ambulatory Visit: Payer: 59

## 2019-06-09 ENCOUNTER — Other Ambulatory Visit: Payer: Self-pay

## 2019-06-09 ENCOUNTER — Emergency Department (HOSPITAL_COMMUNITY)
Admission: EM | Admit: 2019-06-09 | Discharge: 2019-06-09 | Disposition: A | Discharge status: Home / Self Care | Payer: 59 | Attending: Emergency Medicine | Admitting: Emergency Medicine

## 2019-06-09 ENCOUNTER — Encounter (HOSPITAL_COMMUNITY): Payer: Self-pay | Admitting: *Deleted

## 2019-06-09 DIAGNOSIS — Z87891 Personal history of nicotine dependence: Secondary | ICD-10-CM | POA: Diagnosis not present

## 2019-06-09 DIAGNOSIS — R0981 Nasal congestion: Secondary | ICD-10-CM | POA: Diagnosis not present

## 2019-06-09 DIAGNOSIS — R509 Fever, unspecified: Secondary | ICD-10-CM | POA: Diagnosis present

## 2019-06-09 DIAGNOSIS — Z20822 Contact with and (suspected) exposure to covid-19: Secondary | ICD-10-CM | POA: Diagnosis not present

## 2019-06-09 DIAGNOSIS — Z791 Long term (current) use of non-steroidal anti-inflammatories (NSAID): Secondary | ICD-10-CM | POA: Diagnosis not present

## 2019-06-09 DIAGNOSIS — R05 Cough: Secondary | ICD-10-CM | POA: Insufficient documentation

## 2019-06-09 MED ORDER — ACETAMINOPHEN 500 MG PO TABS: 500.0000 mg | ORAL_TABLET | Freq: Four times a day (QID) | ORAL | 0 refills | Status: AC | PRN

## 2019-06-09 MED ORDER — GUAIFENESIN 200 MG PO TABS: 200.0000 mg | ORAL_TABLET | ORAL | 0 refills | Status: DC | PRN

## 2019-06-09 NOTE — ED Provider Notes (Signed)
[facility] EMERGENCY DEPARTMENT Provider Note   CSN: [id number] Arrival date & time: 06/09/19  1731     History Chief Complaint  Patient presents with  . multiple symptoms    Gina Pratt is a 49 y.o. female.  The history is provided by the patient and medical records. No language interpreter was used.     49 year old female with history of hypercholesterolemia presenting with complaints of cold symptoms.  Patient report for the past week she has had subjective fever, chills, body aches, sinus congestion, nonproductive cough, throat irritation, decrease in taste and smell and not feeling well.  She reports that her son recently test positive for COVID-19.  She report most of her symptoms seem to be improving however she now endorsed throat irritation and 2 days ago she was having difficulty breathing.  Those symptoms has abated but she still voiced some concern.  She denies tobacco use.  She has been using over-the-counter medication at home with some improvement.  No history of asthma.  Denies any active pain.  Past Medical History:  Diagnosis Date  . Elevated cholesterol   . Hyperlipidemia   . Seasonal allergies     There are no problems to display for this patient.   Past Surgical History:  Procedure Laterality Date  . DILATION AND CURETTAGE OF UTERUS    . TUBAL LIGATION  2009   F[place]ope ring     OB History    Gravida  3   Para  2   Term  2   Preterm      AB  1   Living  2     SAB  1   TAB      Ectopic      Multiple      Live Births              Family History  Problem Relation Age of Onset  . Hypertension Mother   . Osteoporosis Mother   . Hepatitis Father   . Diabetes Paternal Grandfather   . Breast cancer Neg Hx   . Amblyopia Neg Hx   . Blindness Neg Hx   . Cataracts Neg Hx   . Glaucoma Neg Hx   . Macular degeneration Neg Hx   . Retinal detachment Neg Hx   . Strabismus Neg Hx   . Retinitis pigmentosa Neg Hx       Social History   Tobacco Use  . Smoking status: Former Research scientist (life sciences)  . Smokeless tobacco: Never Used  Substance Use Topics  . Alcohol use: Yes    Comment: social  . Drug use: No    Home Medications Prior to Admission medications   Medication Sig Start Date End Date Taking? Authorizing Provider  fluticasone (FLONASE) 50 MCG/ACT nasal spray as needed. 11/11/14   [provider]  IBUPROFEN PO Take by mouth.    [provider]    Allergies    Patient has no known allergies.  Review of Systems   Review of Systems  All other systems reviewed and are negative.   Physical Exam Updated Vital Signs BP (!) 148/80 (BP Location: Left Arm)   Pulse 93   Temp 98.4 F (36.9 C) (Oral)   Resp 18   Ht 5\' 2"  (1.575 m)   Wt 75.8 kg   LMP 06/08/2017   SpO2 100%   BMI 30.54 kg/m   Physical Exam Vitals and nursing note reviewed.  Constitutional:      General:  She is not in acute distress.    Appearance: She is well-developed.  HENT:     Head: Atraumatic.     Mouth/Throat:     Mouth: Mucous membranes are moist.  Eyes:     Conjunctiva/sclera: Conjunctivae normal.  Cardiovascular:     Rate and Rhythm: Normal rate and regular rhythm.     Pulses: Normal pulses.     Heart sounds: Normal heart sounds.  Pulmonary:     Breath sounds: No wheezing, rhonchi or rales.  Abdominal:     Palpations: Abdomen is soft.     Tenderness: There is no abdominal tenderness.  Musculoskeletal:     Cervical back: Neck supple.  Skin:    Capillary Refill: Capillary refill takes less than 2 seconds.     Findings: No rash.  Neurological:     Mental Status: She is alert and oriented to person, place, and time.  Psychiatric:        Mood and Affect: Mood normal.     ED Results / Procedures / Treatments   Labs (all labs ordered are listed, but only abnormal results are displayed) Labs Reviewed - No data to display  EKG None  Radiology No results found.  Procedures Procedures  (including critical care time)  Medications Ordered in ED Medications - No data to display  ED Course  I have reviewed the triage vital signs and the nursing notes.  Pertinent labs & imaging results that were available during my care of the patient were reviewed by me and considered in my medical decision making (see chart for details).    MDM Rules/Calculators/A&P                      BP (!) 148/80 (BP Location: Left Arm)   Pulse 93   Temp 98.4 F (36.9 C) (Oral)   Resp 18   Ht 5\' 2"  (1.575 m)   Wt 75.8 kg   LMP 06/08/2017   SpO2 100%   BMI 30.54 kg/m   Final Clinical Impression(s) / ED Diagnoses Final diagnoses:  Suspected COVID-19 virus infection    Rx / DC Orders ED Discharge Orders         Ordered    guaiFENesin 200 MG tablet  Every 4 hours PRN     06/09/19 1830    acetaminophen (TYLENOL) 500 MG tablet  Every 6 hours PRN     06/09/19 1830         6:25 PM Patient with recent exposure to her son who test positive COVID-19 presenting with symptoms suggestive of COVID-19 infection.  She is well-appearing, able to speak in complete sentences, satting at 100% on room air in no acute respiratory discomfort.  Lung exam unremarkable her lung sounds are clear throughout.  Throat exam is unremarkable as well.  I did offer a Covid test today but patient declined.  States that she just wants to hear some reassurance.  She does have a PCP that she can follow-up.  Patient given instruction on self-care as well as self quarantine for possible COVID-19 infection.  Work note provided.  Strict return precaution discussed.  I also encourage patient to obtain a pulse oximeter to use at home to monitor her oxygen level as needed.  Gina Pratt was evaluated in Emergency Department on 06/09/2019 for the symptoms described in the history of present illness. She was evaluated in the context of the global COVID-19 pandemic, which necessitated consideration that the patient might be  at  risk for infection with the SARS-CoV-2 virus that causes COVID-19. Institutional protocols and algorithms that pertain to the evaluation of patients at risk for COVID-19 are in a state of rapid change based on information released by regulatory bodies including the CDC and federal and state organizations. These policies and algorithms were followed during the patient's care in the ED.    Domenic Moras, PA-C 06/09/19 1833    Maudie Flakes, MD 06/13/19 [id number]

## 2019-06-09 NOTE — ED Triage Notes (Signed)
The pt is c/o of feeling like there is something in her throat  And that she cannot catch her breath since Friday.  No visible sob at present  lmp none

## 2019-06-09 NOTE — ED Triage Notes (Signed)
The pt son  Had covid pos dec25th body aches one week ago but it has gone now

## 2019-06-09 NOTE — ED Notes (Signed)
Patient Alert and oriented to baseline. Stable and ambulatory to baseline. Patient verbalized understanding of the discharge instructions.  Patient belongings were taken by the patient.   

## 2019-06-09 NOTE — Discharge Instructions (Signed)
Your symptoms are suggestive of COVID-19 infection.  Please follow instruction below.  A work note have been provided for you.  Return if you have worsening shortness of breath or if you have any concerns.

## 2019-07-05 ENCOUNTER — Other Ambulatory Visit: Payer: Self-pay | Admitting: Family Medicine

## 2019-07-05 DIAGNOSIS — Z1231 Encounter for screening mammogram for malignant neoplasm of breast: Secondary | ICD-10-CM

## 2019-07-09 ENCOUNTER — Ambulatory Visit: Payer: 59

## 2019-08-15 ENCOUNTER — Ambulatory Visit: Payer: 59

## 2019-09-09 ENCOUNTER — Other Ambulatory Visit: Payer: Self-pay

## 2019-09-09 ENCOUNTER — Ambulatory Visit
Admission: RE | Admit: 2019-09-09 | Discharge: 2019-09-09 | Disposition: A | Discharge status: Home / Self Care | Payer: 59 | Source: Ambulatory Visit | Attending: Family Medicine | Admitting: Family Medicine

## 2019-09-09 DIAGNOSIS — Z1231 Encounter for screening mammogram for malignant neoplasm of breast: Secondary | ICD-10-CM

## 2019-09-25 ENCOUNTER — Encounter: Payer: 59 | Admitting: Gynecology

## 2019-09-27 ENCOUNTER — Other Ambulatory Visit: Payer: Self-pay

## 2019-09-30 ENCOUNTER — Encounter: Payer: 59 | Admitting: Obstetrics and Gynecology

## 2019-10-03 ENCOUNTER — Encounter: Payer: 59 | Admitting: Gynecology

## 2019-10-23 ENCOUNTER — Encounter: Payer: 59 | Admitting: Obstetrics and Gynecology

## 2019-11-06 ENCOUNTER — Other Ambulatory Visit: Payer: Self-pay

## 2019-11-07 ENCOUNTER — Ambulatory Visit: Payer: 59 | Admitting: Obstetrics and Gynecology

## 2019-11-07 ENCOUNTER — Encounter: Payer: Self-pay | Admitting: Obstetrics and Gynecology

## 2019-11-07 VITALS — BP 122/76 | Ht 60.0 in | Wt 173.0 lb

## 2019-11-07 DIAGNOSIS — N951 Menopausal and female climacteric states: Secondary | ICD-10-CM

## 2019-11-07 DIAGNOSIS — Z01419 Encounter for gynecological examination (general) (routine) without abnormal findings: Secondary | ICD-10-CM

## 2019-11-07 NOTE — Progress Notes (Signed)
Gina Pratt [birth year] [id number]  SUBJECTIVE:  49 y.o. [id number] female for annual routine gynecologic exam. She does have some hot flashes and night sweats.  Also some mood changes and occasional cramping.  She had been amenorrheic since about December and then had recurrence of a period in March, she says the flow is a little heavier than normal, passing small clots but periods still only lasting 3 days with no intermenstrual bleeding.  Current Outpatient Medications  Medication Sig Dispense Refill  . acetaminophen (TYLENOL) 500 MG tablet Take 1 tablet (500 mg total) by mouth every 6 (six) hours as needed. 30 tablet 0  . fluticasone (FLONASE) 50 MCG/ACT nasal spray as needed.  3  . guaiFENesin 200 MG tablet Take 1 tablet (200 mg total) by mouth every 4 (four) hours as needed for cough or to loosen phlegm. 30 tablet 0  . IBUPROFEN PO Take by mouth.     No current facility-administered medications for this visit.   Allergies: Patient has no known allergies.  Patient's last menstrual period was 10/24/2019.  Past medical history,surgical history, problem list, medications, allergies, family history and social history were all reviewed and documented as reviewed in the EPIC chart.  ROS:  Feeling well. No dyspnea or chest pain on exertion.  No abdominal pain, change in bowel habits, black or bloody stools.  No urinary tract symptoms. GYN ROS: +heavier periods, regular periods, no chronic pelvic pain or discharge, no breast pain or new or enlarging lumps on self exam. No neurological complaints.   OBJECTIVE:  Ht 5' (1.524 m)   Wt 173 lb (78.5 kg)   LMP 10/24/2019   BMI 33.79 kg/m  The patient appears well, alert, oriented x 3, in no distress. ENT normal.  Neck supple. No cervical or supraclavicular adenopathy or thyromegaly.  Lungs are clear, good air entry, no wheezes, rhonchi or rales. S1 and S2 normal, no murmurs, regular rate and rhythm.  Abdomen soft without tenderness,  guarding, mass or organomegaly.  Neurological is normal, no focal findings.  BREAST EXAM: breasts appear normal, no suspicious masses, no skin or nipple changes or axillary nodes  PELVIC EXAM: VULVA: normal appearing vulva with no masses, tenderness or lesions, VAGINA: normal appearing vagina with normal color and discharge, no lesions, CERVIX: normal appearing cervix without discharge or lesions, UTERUS: uterus is normal size, shape, consistency and nontender, ADNEXA: normal adnexa in size, nontender and no masses  Chaperone: Caryn Bee present during the examination  ASSESSMENT:  50 y.o. [id number] here for annual gynecologic exam  PLAN:   1. Perimenopausal. Prior tubal ligation for contraception.  We discussed the expected perimenopausal symptoms and what she experiencing is quite typical.  We again discussed the options of over-the-counter products such as black cohosh, soy products.  Prescription options include nonhormonal items such as Effexor, or she could consider going on ERT with its associated risks.  She would like to self manage for now and will let us know if she is having any problems.  Could try Provera if the menstrual bleeding becomes prolonged and/or if heavy.   She will keep [place] updated if any concerns. 2. Pap smear/HPV 2020 normal.  No significant history of abnormal Pap smears.  Next Pap smear due 2025 following the current guidelines recommending the 5 year co-testing interval. 3. Mammogram 08/2019.  Normal breast exam today.  Continue with annual mammogram when due. 4. Colonoscopy is highly recommended starting at age 25 for routine colon cancer screening.  She is  encouraged to call one of the local GI groups to get set up for this.  Contact information is provided upon request. 5. Health maintenance.  No labs today as she normally has these completed with her primary care provider.  Return annually or sooner, prn.  Joseph Pierini MD 11/07/19

## 2019-11-07 NOTE — Patient Instructions (Signed)

## 2020-11-10 ENCOUNTER — Encounter: Payer: 59 | Admitting: Obstetrics and Gynecology

## 2020-12-10 ENCOUNTER — Other Ambulatory Visit: Payer: Self-pay

## 2020-12-10 ENCOUNTER — Encounter: Payer: Self-pay | Admitting: Obstetrics & Gynecology

## 2020-12-10 ENCOUNTER — Ambulatory Visit (INDEPENDENT_AMBULATORY_CARE_PROVIDER_SITE_OTHER): Payer: 59 | Admitting: Obstetrics & Gynecology

## 2020-12-10 VITALS — BP 116/80 | HR 80 | Resp 16 | Ht 59.75 in | Wt 175.0 lb

## 2020-12-10 DIAGNOSIS — Z01419 Encounter for gynecological examination (general) (routine) without abnormal findings: Secondary | ICD-10-CM | POA: Diagnosis not present

## 2020-12-10 DIAGNOSIS — Z6834 Body mass index (BMI) 34.0-34.9, adult: Secondary | ICD-10-CM

## 2020-12-10 DIAGNOSIS — E6609 Other obesity due to excess calories: Secondary | ICD-10-CM

## 2020-12-10 DIAGNOSIS — N951 Menopausal and female climacteric states: Secondary | ICD-10-CM

## 2020-12-10 NOTE — Progress Notes (Signed)
Gina Pratt Oct 24, 1970 [id number]   History:    50 y.o.  G3P2A1L2  Married, husband from [place]  Patient from [place].  S/P BT/S.  Sons 33, 61 yo.  RP:  Established patient presenting for annual gyn exam   HPI:  Perimeopausal, LMP 03/2020.  Mild hot flushes.  No pelvic pain.  No pain with IC.  Last Pap Neg 08/2018.  Breasts normal  Urine/BMs normal.  Colonoscopy not done yet, will schedule consult with [facility].  BMI 34.46.  Health labs with Fam MD.    Past medical history,surgical history, family history and social history were all reviewed and documented in the EPIC chart.  Gynecologic History Patient's last menstrual period was 03/30/2020 (approximate).  Obstetric History OB History  Gravida Para Term Preterm AB Living  3 2 2   1 2   SAB IAB Ectopic Multiple Live Births  1            # Outcome Date GA Lbr Len/2nd Weight Sex Delivery Anes PTL Lv  3 SAB           2 Term           1 Term              ROS: A ROS was performed and pertinent positives and negatives are included in the history.  GENERAL: No fevers or chills. HEENT: No change in vision, no earache, sore throat or sinus congestion. NECK: No pain or stiffness. CARDIOVASCULAR: No chest pain or pressure. No palpitations. PULMONARY: No shortness of breath, cough or wheeze. GASTROINTESTINAL: No abdominal pain, nausea, vomiting or diarrhea, melena or bright red blood per rectum. GENITOURINARY: No urinary frequency, urgency, hesitancy or dysuria. MUSCULOSKELETAL: No joint or muscle pain, no back pain, no recent trauma. DERMATOLOGIC: No rash, no itching, no lesions. ENDOCRINE: No polyuria, polydipsia, no heat or cold intolerance. No recent change in weight. HEMATOLOGICAL: No anemia or easy bruising or bleeding. NEUROLOGIC: No headache, seizures, numbness, tingling or weakness. PSYCHIATRIC: No depression, no loss of interest in normal activity or change in sleep pattern.     Exam:   BP 116/80   Pulse 80   Resp 16    Ht 4' 11.75" (1.518 m)   Wt 175 lb (79.4 kg)   LMP 03/30/2020 (Approximate)   BMI 34.46 kg/m   Body mass index is 34.46 kg/m.  General appearance : Well developed well nourished female. No acute distress HEENT: Eyes: no retinal hemorrhage or exudates,  Neck supple, trachea midline, no carotid bruits, no thyroidmegaly Lungs: Clear to auscultation, no rhonchi or wheezes, or rib retractions  Heart: Regular rate and rhythm, no murmurs or gallops Breast:Examined in sitting and supine position were symmetrical in appearance, no palpable masses or tenderness,  no skin retraction, no nipple inversion, no nipple discharge, no skin discoloration, no axillary or supraclavicular lymphadenopathy Abdomen: no palpable masses or tenderness, no rebound or guarding Extremities: no edema or skin discoloration or tenderness  Pelvic: Vulva: Normal             Vagina: No gross lesions or discharge  Cervix: No gross lesions or discharge  Uterus  AV, normal size, shape and consistency, non-tender and mobile  Adnexa  Without masses or tenderness  Anus: Normal   Assessment/Plan:  50 y.o. female for annual exam   1. Well female exam with routine gynecological exam Normal gynecologic exam.  We will repeat a Pap test at 3 years next year.  Breast exam normal.  We will repeat a screening mammogram now.  Recommend scheduling a consultation with gastroenterology for a colonoscopy.  Health labs with family physician.  2. Perimenopausal Will observe at this time.  May try black cohosh and soy products and nutrition.  Recommend vitamin D supplements 1000 international unit daily and calcium intake of a total of 1.5 g/day.  Weightbearing physical activities recommended on a regular basis.  3. Class 1 obesity due to excess calories without serious comorbidity with body mass index (BMI) of 34.0 to 34.9 in adult Recommend a lower calorie/carb diet.  Intermittent fasting suggested.  Aerobic activities 5 times a week and  light weightlifting every 2 days.  Other orders - Ascorbic Acid (VITAMIN C PO); Take by mouth. - VITAMIN D PO; Take by mouth. - Cyanocobalamin (B-12 PO); Take by mouth.   Princess Bruins MD, 10:44 AM 12/10/2020

## 2021-05-18 ENCOUNTER — Other Ambulatory Visit: Payer: Self-pay | Admitting: Family Medicine

## 2021-05-18 DIAGNOSIS — Z1231 Encounter for screening mammogram for malignant neoplasm of breast: Secondary | ICD-10-CM

## 2021-05-19 ENCOUNTER — Ambulatory Visit
Admission: RE | Admit: 2021-05-19 | Discharge: 2021-05-19 | Disposition: A | Discharge status: Home / Self Care | Payer: 59 | Source: Ambulatory Visit | Attending: Family Medicine | Admitting: Family Medicine

## 2021-05-19 DIAGNOSIS — Z1231 Encounter for screening mammogram for malignant neoplasm of breast: Secondary | ICD-10-CM

## 2021-07-05 ENCOUNTER — Encounter: Payer: Self-pay | Admitting: Nurse Practitioner

## 2021-07-05 ENCOUNTER — Other Ambulatory Visit: Payer: Self-pay

## 2021-07-05 ENCOUNTER — Ambulatory Visit: Payer: 59 | Admitting: Nurse Practitioner

## 2021-07-05 VITALS — BP 124/80

## 2021-07-05 DIAGNOSIS — N898 Other specified noninflammatory disorders of vagina: Secondary | ICD-10-CM | POA: Diagnosis not present

## 2021-07-05 DIAGNOSIS — B3731 Acute candidiasis of vulva and vagina: Secondary | ICD-10-CM

## 2021-07-05 DIAGNOSIS — R300 Dysuria: Secondary | ICD-10-CM

## 2021-07-05 LAB — WET PREP FOR TRICH, YEAST, CLUE

## 2021-07-05 MED ORDER — FLUCONAZOLE 150 MG PO TABS: 150.0000 mg | ORAL_TABLET | ORAL | 0 refills | Status: DC

## 2021-07-05 NOTE — Patient Instructions (Signed)

## 2021-07-05 NOTE — Progress Notes (Signed)
° °  Acute Office Visit  Subjective:    Patient ID: Gina Pratt, female    DOB: Oct 08, 1970, 50 y.o.   MRN: [id number]   HPI 51 y.o. presents today for burning with urination and vaginal discharge and itching x 1 week. Itching is mostly external, discharge is clear. No OTC treatment.    Review of Systems  Constitutional: Negative.   Genitourinary:  Positive for vaginal discharge and vaginal pain (Itching). Negative for difficulty urinating, dysuria, flank pain, frequency, genital sores, hematuria, pelvic pain and urgency.      Objective:    Physical Exam Constitutional:      Appearance: Normal appearance.  Genitourinary:    General: Normal vulva.     Vagina: Vaginal discharge present. No erythema.     Cervix: Normal.    BP 124/80    LMP 06/02/2021 (Approximate)  Wt Readings from Last 3 Encounters:  12/10/20 175 lb (79.4 kg)  11/07/19 173 lb (78.5 kg)  06/09/19 167 lb (75.8 kg)   UA negative Wet prep + yeast     Assessment & Plan:   Problem List Items Addressed This Visit   None Visit Diagnoses     Vulvovaginal candidiasis    -  Primary   Relevant Medications   fluconazole (DIFLUCAN) 150 MG tablet   Vagina itching       Relevant Orders   WET PREP FOR TRICH, YEAST, CLUE   Burning with urination       Relevant Orders   Urinalysis,Complete w/RFL Culture      Plan: Wet prep positive for yeast - Diflucan 150 mg today and repeat in 3 days if symptoms persist. UA unremarkable.      Tamela Gammon DNP, 12:11 PM 07/05/2021

## 2021-07-07 LAB — URINE CULTURE
MICRO NUMBER:: 12967815
Result:: NO GROWTH
SPECIMEN QUALITY:: ADEQUATE

## 2021-07-07 LAB — URINALYSIS, COMPLETE W/RFL CULTURE
Bilirubin Urine: NEGATIVE
Casts: NONE SEEN /LPF
Crystals: NONE SEEN /HPF
Glucose, UA: NEGATIVE
Hyaline Cast: NONE SEEN /LPF
Ketones, ur: NEGATIVE
Leukocyte Esterase: NEGATIVE
Nitrites, Initial: NEGATIVE
Protein, ur: NEGATIVE
Specific Gravity, Urine: 1.025 (ref 1.001–1.035)
Yeast: NONE SEEN /HPF
pH: 5.5 (ref 5.0–8.0)

## 2021-07-07 LAB — CULTURE INDICATED

## 2021-12-14 ENCOUNTER — Ambulatory Visit (INDEPENDENT_AMBULATORY_CARE_PROVIDER_SITE_OTHER): Payer: 59 | Admitting: Obstetrics & Gynecology

## 2021-12-14 ENCOUNTER — Other Ambulatory Visit (HOSPITAL_COMMUNITY)
Admission: RE | Admit: 2021-12-14 | Discharge: 2021-12-14 | Disposition: A | Discharge status: Home / Self Care | Payer: 59 | Source: Ambulatory Visit | Attending: Obstetrics & Gynecology | Admitting: Obstetrics & Gynecology

## 2021-12-14 ENCOUNTER — Encounter: Payer: Self-pay | Admitting: Obstetrics & Gynecology

## 2021-12-14 VITALS — BP 118/80 | HR 72 | Ht 59.5 in | Wt 176.0 lb

## 2021-12-14 DIAGNOSIS — E6609 Other obesity due to excess calories: Secondary | ICD-10-CM | POA: Diagnosis not present

## 2021-12-14 DIAGNOSIS — Z01419 Encounter for gynecological examination (general) (routine) without abnormal findings: Secondary | ICD-10-CM | POA: Diagnosis present

## 2021-12-14 DIAGNOSIS — Z6834 Body mass index (BMI) 34.0-34.9, adult: Secondary | ICD-10-CM | POA: Diagnosis not present

## 2021-12-14 DIAGNOSIS — N951 Menopausal and female climacteric states: Secondary | ICD-10-CM | POA: Diagnosis not present

## 2021-12-14 LAB — FOLLICLE STIMULATING HORMONE: FSH: 48.7 m[IU]/mL

## 2021-12-14 NOTE — Progress Notes (Signed)
Gina Pratt 05-14-1971 [id number]   History:    51 y.o.  G3P2A1L2  Married, husband from [place]  Patient from [place].  S/P BT/S.  Sons 38, 57 yo.   RP:  Established patient presenting for annual gyn exam    HPI:  Perimeopausal, LMP 03/2021.  Mild hot flushes on-off.  No pelvic pain.  No pain with IC.  Last Pap Neg 08/2018.  Pap reflex done today.  Breasts normal.  Mammo Neg 04/2021.  Urine/BMs normal. Colonoscopy not done yet, will schedule consult with [facility].  BMI 34.95.  Health labs with Fam MD.     Past medical history,surgical history, family history and social history were all reviewed and documented in the EPIC chart.  Gynecologic History No LMP recorded. (Menstrual status: Perimenopausal).  Obstetric History OB History  Gravida Para Term Preterm AB Living  '3 2 2   1 2  '$ SAB IAB Ectopic Multiple Live Births  1            # Outcome Date GA Lbr Len/2nd Weight Sex Delivery Anes PTL Lv  3 SAB           2 Term           1 Term              ROS: A ROS was performed and pertinent positives and negatives are included in the history. GENERAL: No fevers or chills. HEENT: No change in vision, no earache, sore throat or sinus congestion. NECK: No pain or stiffness. CARDIOVASCULAR: No chest pain or pressure. No palpitations. PULMONARY: No shortness of breath, cough or wheeze. GASTROINTESTINAL: No abdominal pain, nausea, vomiting or diarrhea, melena or bright red blood per rectum. GENITOURINARY: No urinary frequency, urgency, hesitancy or dysuria. MUSCULOSKELETAL: No joint or muscle pain, no back pain, no recent trauma. DERMATOLOGIC: No rash, no itching, no lesions. ENDOCRINE: No polyuria, polydipsia, no heat or cold intolerance. No recent change in weight. HEMATOLOGICAL: No anemia or easy bruising or bleeding. NEUROLOGIC: No headache, seizures, numbness, tingling or weakness. PSYCHIATRIC: No depression, no loss of interest in normal activity or change in sleep pattern.      Exam:   BP 118/80   Pulse 72   Ht 4' 11.5" (1.511 m)   Wt 176 lb (79.8 kg)   SpO2 99%   BMI 34.95 kg/m   Body mass index is 34.95 kg/m.  General appearance : Well developed well nourished female. No acute distress HEENT: Eyes: no retinal hemorrhage or exudates,  Neck supple, trachea midline, no carotid bruits, no thyroidmegaly Lungs: Clear to auscultation, no rhonchi or wheezes, or rib retractions  Heart: Regular rate and rhythm, no murmurs or gallops Breast:Examined in sitting and supine position were symmetrical in appearance, no palpable masses or tenderness,  no skin retraction, no nipple inversion, no nipple discharge, no skin discoloration, no axillary or supraclavicular lymphadenopathy Abdomen: no palpable masses or tenderness, no rebound or guarding Extremities: no edema or skin discoloration or tenderness  Pelvic: Vulva: Normal             Vagina: No gross lesions or discharge  Cervix: No gross lesions or discharge.  Pap reflex done  Uterus  AV, normal size, shape and consistency, non-tender and mobile  Adnexa  Without masses or tenderness  Anus: Normal   Assessment/Plan:  51 y.o. female for annual exam   1. Encounter for routine gynecological examination with Papanicolaou smear of cervix Perimeopausal, LMP 03/2021.  Mild hot flushes  on-off.  No pelvic pain.  No pain with IC.  Last Pap Neg 08/2018.  Pap reflex done today.  Breasts normal.  Mammo Neg 04/2021.  Urine/BMs normal. Colonoscopy not done yet, will schedule consult with [facility].  BMI 34.95.  Health labs with Fam MD.   [facility]  2. Perimenopausal Perimeopausal, LMP 03/2021.  Mild hot flushes on-off.  No pelvic pain.  No pain with IC.  Mother with H/O Osteoporosis.  Will check [facility] today.  HRT risks/benefits/usage reviewed.  Will observe at this time.   - FSH  3. Class 1 obesity due to excess calories without serious comorbidity with body mass index (BMI) of 34.0 to 34.9 in adult  Low  calorie/carb diet.  Increase fitness activities.  Princess Bruins MD, 10:13 AM 12/14/2021

## 2021-12-15 LAB — CYTOLOGY - PAP: Diagnosis: NEGATIVE

## 2022-01-14 ENCOUNTER — Ambulatory Visit (HOSPITAL_BASED_OUTPATIENT_CLINIC_OR_DEPARTMENT_OTHER)
Admission: RE | Admit: 2022-01-14 | Discharge: 2022-01-14 | Disposition: A | Discharge status: Home / Self Care | Payer: 59 | Source: Ambulatory Visit | Attending: Family Medicine | Admitting: Family Medicine

## 2022-01-14 ENCOUNTER — Other Ambulatory Visit (HOSPITAL_BASED_OUTPATIENT_CLINIC_OR_DEPARTMENT_OTHER): Payer: Self-pay | Admitting: Family Medicine

## 2022-01-14 DIAGNOSIS — R109 Unspecified abdominal pain: Secondary | ICD-10-CM | POA: Diagnosis present

## 2022-01-14 MED ORDER — IOHEXOL 300 MG/ML  SOLN
100.0000 mL | Freq: Once | INTRAMUSCULAR | Status: AC | PRN
  Administered 2022-01-14: 100 mL via INTRAVENOUS

## 2022-09-07 ENCOUNTER — Encounter: Payer: Self-pay | Admitting: Orthopaedic Surgery

## 2022-09-07 ENCOUNTER — Ambulatory Visit
Admission: RE | Admit: 2022-09-07 | Discharge: 2022-09-07 | Disposition: A | Discharge status: Home / Self Care | Payer: 59 | Source: Ambulatory Visit | Attending: Orthopaedic Surgery | Admitting: Orthopaedic Surgery

## 2022-09-07 ENCOUNTER — Other Ambulatory Visit: Payer: Self-pay | Admitting: Orthopaedic Surgery

## 2022-09-07 DIAGNOSIS — M25572 Pain in left ankle and joints of left foot: Secondary | ICD-10-CM

## 2022-10-20 ENCOUNTER — Other Ambulatory Visit: Payer: Self-pay | Admitting: Family Medicine

## 2022-10-20 DIAGNOSIS — Z0000 Encounter for general adult medical examination without abnormal findings: Secondary | ICD-10-CM

## 2022-10-21 ENCOUNTER — Ambulatory Visit
Admission: RE | Admit: 2022-10-21 | Discharge: 2022-10-21 | Disposition: A | Discharge status: Home / Self Care | Payer: 59 | Source: Ambulatory Visit | Attending: Family Medicine | Admitting: Family Medicine

## 2022-10-21 DIAGNOSIS — Z0000 Encounter for general adult medical examination without abnormal findings: Secondary | ICD-10-CM

## 2022-10-27 ENCOUNTER — Other Ambulatory Visit: Payer: Self-pay | Admitting: Family Medicine

## 2022-10-27 DIAGNOSIS — R928 Other abnormal and inconclusive findings on diagnostic imaging of breast: Secondary | ICD-10-CM

## 2022-11-09 ENCOUNTER — Ambulatory Visit
Admission: RE | Admit: 2022-11-09 | Discharge: 2022-11-09 | Disposition: A | Discharge status: Home / Self Care | Payer: 59 | Source: Ambulatory Visit | Attending: Family Medicine | Admitting: Family Medicine

## 2022-11-09 DIAGNOSIS — R928 Other abnormal and inconclusive findings on diagnostic imaging of breast: Secondary | ICD-10-CM

## 2022-12-29 HISTORY — PX: OTHER SURGICAL HISTORY: SHX169

## 2023-01-31 NOTE — Progress Notes (Signed)
52 y.o. G96P2012 Married Hispanic female here for annual exam.    Menopause symptoms and hot flashes have improved.  Some fatigue, mood swings, gaining weight.  Decreased libido.  No pain with intercourse.  Partner has some issues.  LMP 05/2021. [facility] 12/14/21 - 48.7.   Work stress, some sadness and anxiety.  Does not feel depressed but has anxiety and worry.  Has not received treatment for this in the past. Denies suicidal ideation.   Had cataract surgery.   From [place].  Her mother lives there and her father is in [place].  2 sons.   Does Medicaid eligibility.   PCP:  Sigmund Hazel, MD   Patient's last menstrual period was 06/02/2021 (approximate).           Sexually active: Yes.    The current method of family planning is postmenopausal.    Exercising: No.     Smoker:  former  Health Maintenance: Pap:  12/14/21 neg History of abnormal Pap:  no MMG:  11/09/22 Breast Density Cat B, BI-RADS CAT 2 benign Colonoscopy:  cologuard 2023 - normal per patient - PCP BMD:   n/a  Result  n/a TDaP:  unsure Gardasil:   no HIV: unsure Hep C: unsure Screening Labs:  PCP   reports that she has quit smoking. She has never used smokeless tobacco. She reports current alcohol use. She reports that she does not use drugs.  Past Medical History:  Diagnosis Date   Elevated cholesterol    Hyperlipidemia    Seasonal allergies     Past Surgical History:  Procedure Laterality Date   cataracts Bilateral 12/2022   DILATION AND CURETTAGE OF UTERUS     TUBAL LIGATION  2009   Falope ring    Current Outpatient Medications  Medication Sig Dispense Refill   acetaminophen (TYLENOL) 500 MG tablet Take 1 tablet (500 mg total) by mouth every 6 (six) hours as needed. 30 tablet 0   Ascorbic Acid (VITAMIN C PO) Take by mouth.     Cyanocobalamin (B-12 PO) Take by mouth.     fluticasone (FLONASE) 50 MCG/ACT nasal spray as needed.  3   IBUPROFEN PO Take by mouth.     Prednisol Ace-Moxiflox-Bromfen  1-0.5-0.075 % SUSP Place 1 drop into the right eye 4 (four) times daily.     VITAMIN D PO Take by mouth.     No current facility-administered medications for this visit.    Family History  Problem Relation Age of Onset   Hypertension Mother    Osteoporosis Mother    Hepatitis Father    Diabetes Paternal Grandfather    Breast cancer Neg Hx    Amblyopia Neg Hx    Blindness Neg Hx    Cataracts Neg Hx    Glaucoma Neg Hx    Macular degeneration Neg Hx    Retinal detachment Neg Hx    Strabismus Neg Hx    Retinitis pigmentosa Neg Hx     Review of Systems  All other systems reviewed and are negative.   Exam:   Ht 5' (1.524 m)   Wt 171 lb (77.6 kg)   LMP 06/02/2021 (Approximate)   BMI 33.40 kg/m     General appearance: alert, cooperative and appears stated age Head: normocephalic, without obvious abnormality, atraumatic Neck: no adenopathy, supple, symmetrical, trachea midline and thyroid normal to inspection and palpation Lungs: clear to auscultation bilaterally Breasts: normal appearance, no masses or tenderness, No nipple retraction or dimpling, No nipple discharge or  bleeding, No axillary adenopathy Heart: regular rate and rhythm Abdomen: soft, non-tender; no masses, no organomegaly Extremities: extremities normal, atraumatic, no cyanosis or edema Skin: skin color, texture, turgor normal. No rashes or lesions Lymph nodes: cervical, supraclavicular, and axillary nodes normal. Neurologic: grossly normal  Pelvic: External genitalia:  no lesions              No abnormal inguinal nodes palpated.              Urethra:  normal appearing urethra with no masses, tenderness or lesions              Bartholins and Skenes: normal                 Vagina: normal appearing vagina with normal color and discharge, no lesions              Cervix: no lesions              Pap taken: no Bimanual Exam:  Uterus:  normal size, contour, position, consistency, mobility, non-tender               Adnexa: no mass, fullness, tenderness              Rectal exam: yes.  Confirms.              Anus:  normal sphincter tone, no lesions  Chaperone was present for exam:  Warren Lacy, CMA  Assessment:   Well woman visit with gynecologic exam. Postmenopausal female.  Menopausal symptoms.  Decreased libido.  Anxiety.   Plan: Mammogram screening discussed. Self breast awareness reviewed. Pap and HR HPV as above. Guidelines for Calcium, Vitamin D, regular exercise program including cardiovascular and weight bearing exercise. We discussed options for tx including HRT, SSRI/SNRI, testosterone. Discused WHI and use of HRT which can increase risk of PE, DVT, MI, stroke and breast cancer.  Will start vivelle Dot 0.05 mg twice weekly and Prometrium 100 mg q hs.  Fu in 6 - 8 weeks.   Brochure given for potential counseling at [facility].  Follow up annually and prn.

## 2023-02-14 ENCOUNTER — Encounter: Payer: Self-pay | Admitting: Obstetrics and Gynecology

## 2023-02-14 ENCOUNTER — Ambulatory Visit (INDEPENDENT_AMBULATORY_CARE_PROVIDER_SITE_OTHER): Payer: 59 | Admitting: Obstetrics and Gynecology

## 2023-02-14 VITALS — BP 136/86 | HR 71 | Ht 60.0 in | Wt 171.0 lb

## 2023-02-14 DIAGNOSIS — Z01419 Encounter for gynecological examination (general) (routine) without abnormal findings: Secondary | ICD-10-CM | POA: Diagnosis not present

## 2023-02-14 DIAGNOSIS — N951 Menopausal and female climacteric states: Secondary | ICD-10-CM

## 2023-02-14 MED ORDER — ESTRADIOL 0.05 MG/24HR TD PTTW: 1.0000 | MEDICATED_PATCH | TRANSDERMAL | 0 refills | Status: AC

## 2023-02-14 MED ORDER — PROGESTERONE MICRONIZED 100 MG PO CAPS: 100.0000 mg | ORAL_CAPSULE | Freq: Every day | ORAL | 0 refills | Status: AC

## 2023-02-14 NOTE — Patient Instructions (Signed)

## 2023-03-21 NOTE — Progress Notes (Deleted)
GYNECOLOGY  VISIT   HPI: 52 y.o.   Married  Hispanic  female   [phone] with Patient's last menstrual period was 06/02/2021 (approximate).   here for   HRT f/u  GYNECOLOGIC HISTORY: Patient's last menstrual period was 06/02/2021 (approximate). Contraception:  PMP Menopausal hormone therapy:  estradiol patch, progesterone Last mammogram:   11/09/22 Breast Density Cat B, BI-RADS CAT 2 benign  Last pap smear:   12/14/21 neg         OB History     Gravida  3   Para  2   Term  2   Preterm      AB  1   Living  2      SAB  1   IAB      Ectopic      Multiple      Live Births                 There are no problems to display for this patient.   Past Medical History:  Diagnosis Date   Elevated cholesterol    Hyperlipidemia    Seasonal allergies     Past Surgical History:  Procedure Laterality Date   cataracts Bilateral 12/2022   DILATION AND CURETTAGE OF UTERUS     TUBAL LIGATION  2009   Falope ring    Current Outpatient Medications  Medication Sig Dispense Refill   acetaminophen (TYLENOL) 500 MG tablet Take 1 tablet (500 mg total) by mouth every 6 (six) hours as needed. 30 tablet 0   Ascorbic Acid (VITAMIN C PO) Take by mouth.     Cyanocobalamin (B-12 PO) Take by mouth.     estradiol (VIVELLE-DOT) 0.05 MG/24HR patch Place 1 patch (0.05 mg total) onto the skin 2 (two) times a week. 24 patch 0   fluticasone (FLONASE) 50 MCG/ACT nasal spray as needed.  3   IBUPROFEN PO Take by mouth.     Prednisol Ace-Moxiflox-Bromfen 1-0.5-0.075 % SUSP Place 1 drop into the right eye 4 (four) times daily.     progesterone (PROMETRIUM) 100 MG capsule Take 1 capsule (100 mg total) by mouth daily. Take at bedtime. 90 capsule 0   VITAMIN D PO Take by mouth.     No current facility-administered medications for this visit.     ALLERGIES: Patient has no known allergies.  Family History  Problem Relation Age of Onset   Hypertension Mother    Osteoporosis Mother     Hepatitis Father    Diabetes Paternal Grandfather    Breast cancer Neg Hx    Amblyopia Neg Hx    Blindness Neg Hx    Cataracts Neg Hx    Glaucoma Neg Hx    Macular degeneration Neg Hx    Retinal detachment Neg Hx    Strabismus Neg Hx    Retinitis pigmentosa Neg Hx     Social History   Socioeconomic History   Marital status: Married    Spouse name: Not on file   Number of children: Not on file   Years of education: Not on file   Highest education level: Not on file  Occupational History   Not on file  Tobacco Use   Smoking status: Former   Smokeless tobacco: Never  Vaping Use   Vaping status: Never Used  Substance and Sexual Activity   Alcohol use: Yes    Comment: social   Drug use: No   Sexual activity: Yes    Birth control/protection: Surgical  Comment: BTL-1st intercourse 52 yo-Fewer than 5 partners  Other Topics Concern   Not on file  Social History Narrative   Not on file   Social Determinants of Health   Financial Resource Strain: Not on file  Food Insecurity: Not on file  Transportation Needs: Not on file  Physical Activity: Not on file  Stress: Not on file  Social Connections: Not on file  Intimate Partner Violence: Not on file    Review of Systems  PHYSICAL EXAMINATION:    LMP 06/02/2021 (Approximate)     General appearance: alert, cooperative and appears stated age Head: Normocephalic, without obvious abnormality, atraumatic Neck: no adenopathy, supple, symmetrical, trachea midline and thyroid normal to inspection and palpation Lungs: clear to auscultation bilaterally Breasts: normal appearance, no masses or tenderness, No nipple retraction or dimpling, No nipple discharge or bleeding, No axillary or supraclavicular adenopathy Heart: regular rate and rhythm Abdomen: soft, non-tender, no masses,  no organomegaly Extremities: extremities normal, atraumatic, no cyanosis or edema Skin: Skin color, texture, turgor normal. No rashes or  lesions Lymph nodes: Cervical, supraclavicular, and axillary nodes normal. No abnormal inguinal nodes palpated Neurologic: Grossly normal  Pelvic: External genitalia:  no lesions              Urethra:  normal appearing urethra with no masses, tenderness or lesions              Bartholins and Skenes: normal                 Vagina: normal appearing vagina with normal color and discharge, no lesions              Cervix: no lesions                Bimanual Exam:  Uterus:  normal size, contour, position, consistency, mobility, non-tender              Adnexa: no mass, fullness, tenderness              Rectal exam: {yes no:314532}.  Confirms.              Anus:  normal sphincter tone, no lesions  Chaperone was present for exam:  ***  ASSESSMENT     PLAN     An After Visit Summary was printed and given to the patient.  ______ minutes face to face time of which over 50% was spent in counseling.

## 2023-04-04 ENCOUNTER — Ambulatory Visit: Payer: 59 | Admitting: Obstetrics and Gynecology

## 2024-02-14 NOTE — Progress Notes (Deleted)
 53 y.o. G62P2012 Married Hispanic female here for annual exam.    PCP: Cleotilde Planas, MD   Patient's last menstrual period was 06/02/2021 (approximate).           Sexually active: Yes.    The current method of family planning is tubal ligation.    Menopausal hormone therapy:  Vivelle  & progesterone   Exercising: {yes no:314532}  {types:19826} Smoker:  Former   OB History  Gravida Para Term Preterm AB Living  3 2 2  1 2   SAB IAB Ectopic Multiple Live Births  1        # Outcome Date GA Lbr Len/2nd Weight Sex Type Anes PTL Lv  3 SAB           2 Term           1 Term              HEALTH MAINTENANCE: Last 2 paps:  12/14/21 neg, 09/27/18 neg  History of abnormal Pap or positive HPV:  no Mammogram:   11/09/22 L Breast - Breast Density Cat B, BIRADS Cat 2 benign  Colonoscopy:   Bone Density:  n/a  Result  n/a    There is no immunization history on file for this patient.    reports that she has quit smoking. She has never used smokeless tobacco. She reports current alcohol use. She reports that she does not use drugs.  Past Medical History:  Diagnosis Date   Elevated cholesterol    Hyperlipidemia    Seasonal allergies     Past Surgical History:  Procedure Laterality Date   cataracts Bilateral 12/2022   DILATION AND CURETTAGE OF UTERUS     TUBAL LIGATION  2009   Falope ring    Current Outpatient Medications  Medication Sig Dispense Refill   acetaminophen  (TYLENOL ) 500 MG tablet Take 1 tablet (500 mg total) by mouth every 6 (six) hours as needed. 30 tablet 0   Ascorbic Acid (VITAMIN C PO) Take by mouth.     Cyanocobalamin (B-12 PO) Take by mouth.     estradiol  (VIVELLE -DOT) 0.05 MG/24HR patch Place 1 patch (0.05 mg total) onto the skin 2 (two) times a week. 24 patch 0   fluticasone (FLONASE) 50 MCG/ACT nasal spray as needed.  3   IBUPROFEN PO Take by mouth.     Prednisol Ace-Moxiflox-Bromfen 1-0.5-0.075 % SUSP Place 1 drop into the right eye 4 (four) times daily.      progesterone  (PROMETRIUM ) 100 MG capsule Take 1 capsule (100 mg total) by mouth daily. Take at bedtime. 90 capsule 0   VITAMIN D PO Take by mouth.     No current facility-administered medications for this visit.    ALLERGIES: Patient has no known allergies.  Family History  Problem Relation Age of Onset   Hypertension Mother    Osteoporosis Mother    Hepatitis Father    Diabetes Paternal Grandfather    Breast cancer Neg Hx    Amblyopia Neg Hx    Blindness Neg Hx    Cataracts Neg Hx    Glaucoma Neg Hx    Macular degeneration Neg Hx    Retinal detachment Neg Hx    Strabismus Neg Hx    Retinitis pigmentosa Neg Hx     Review of Systems  PHYSICAL EXAM:  LMP 06/02/2021 (Approximate)     General appearance: alert, cooperative and appears stated age Head: normocephalic, without obvious abnormality, atraumatic Neck: no adenopathy, supple, symmetrical, trachea midline and  thyroid  normal to inspection and palpation Lungs: clear to auscultation bilaterally Breasts: normal appearance, no masses or tenderness, No nipple retraction or dimpling, No nipple discharge or bleeding, No axillary adenopathy Heart: regular rate and rhythm Abdomen: soft, non-tender; no masses, no organomegaly Extremities: extremities normal, atraumatic, no cyanosis or edema Skin: skin color, texture, turgor normal. No rashes or lesions Lymph nodes: cervical, supraclavicular, and axillary nodes normal. Neurologic: grossly normal  Pelvic: External genitalia:  no lesions              No abnormal inguinal nodes palpated.              Urethra:  normal appearing urethra with no masses, tenderness or lesions              Bartholins and Skenes: normal                 Vagina: normal appearing vagina with normal color and discharge, no lesions              Cervix: no lesions              Pap taken: {yes no:314532} Bimanual Exam:  Uterus:  normal size, contour, position, consistency, mobility, non-tender               Adnexa: no mass, fullness, tenderness              Rectal exam: {yes no:314532}.  Confirms.              Anus:  normal sphincter tone, no lesions  Chaperone was present for exam:  {BSCHAPERONE:31226::Emily F, CMA}  ASSESSMENT: Well woman visit with gynecologic exam.  PHQ-2-9: ***  ***  PLAN: Mammogram screening discussed. Self breast awareness reviewed. Pap and HRV collected:  {yes no:314532} Guidelines for Calcium, Vitamin D, regular exercise program including cardiovascular and weight bearing exercise. Medication refills:  *** {LABS (Optional):23779} Follow up:  ***    Additional counseling given.  {yes [id number]. ***  total time was spent for this patient encounter, including preparation, face-to-face counseling with the patient, coordination of care, and documentation of the encounter in addition to doing the well woman visit with gynecologic exam.

## 2024-02-15 ENCOUNTER — Ambulatory Visit: Payer: 59 | Admitting: Obstetrics and Gynecology

## 2024-11-14 ENCOUNTER — Ambulatory Visit: Admitting: Obstetrics and Gynecology
